# Patient Record
Sex: Female | Born: 1965 | Race: Black or African American | Hispanic: No | Smoking: Never smoker
Health system: Southern US, Community
[De-identification: ages and names within clinical notes are randomized; demographics above are authoritative.]

## PROBLEM LIST (undated history)

## (undated) DIAGNOSIS — N809 Endometriosis, unspecified: Secondary | ICD-10-CM

## (undated) DIAGNOSIS — R51 Headache: Secondary | ICD-10-CM

## (undated) DIAGNOSIS — I509 Heart failure, unspecified: Secondary | ICD-10-CM

## (undated) DIAGNOSIS — F419 Anxiety disorder, unspecified: Secondary | ICD-10-CM

## (undated) DIAGNOSIS — F32A Depression, unspecified: Secondary | ICD-10-CM

## (undated) DIAGNOSIS — I2699 Other pulmonary embolism without acute cor pulmonale: Secondary | ICD-10-CM

## (undated) DIAGNOSIS — I10 Essential (primary) hypertension: Secondary | ICD-10-CM

## (undated) DIAGNOSIS — F329 Major depressive disorder, single episode, unspecified: Secondary | ICD-10-CM

## (undated) DIAGNOSIS — I639 Cerebral infarction, unspecified: Secondary | ICD-10-CM

## (undated) DIAGNOSIS — R519 Headache, unspecified: Secondary | ICD-10-CM

## (undated) DIAGNOSIS — D6861 Antiphospholipid syndrome: Secondary | ICD-10-CM

## (undated) DIAGNOSIS — J90 Pleural effusion, not elsewhere classified: Secondary | ICD-10-CM

## (undated) HISTORY — PX: ABDOMINAL HYSTERECTOMY: SHX81

## (undated) HISTORY — PX: APPENDECTOMY: SHX54

## (undated) HISTORY — PX: PLEURAL BIOPSY: SHX5082

---

## 1999-07-04 ENCOUNTER — Other Ambulatory Visit: Admission: RE | Admit: 1999-07-04 | Discharge: 1999-07-04 | Payer: Self-pay | Admitting: Obstetrics and Gynecology

## 2000-07-08 ENCOUNTER — Other Ambulatory Visit: Admission: RE | Admit: 2000-07-08 | Discharge: 2000-07-08 | Payer: Self-pay | Admitting: Obstetrics and Gynecology

## 2001-07-31 ENCOUNTER — Other Ambulatory Visit: Admission: RE | Admit: 2001-07-31 | Discharge: 2001-07-31 | Payer: Self-pay | Admitting: Obstetrics and Gynecology

## 2002-07-28 ENCOUNTER — Other Ambulatory Visit: Admission: RE | Admit: 2002-07-28 | Discharge: 2002-07-28 | Payer: Self-pay | Admitting: Obstetrics and Gynecology

## 2003-08-09 ENCOUNTER — Other Ambulatory Visit: Admission: RE | Admit: 2003-08-09 | Discharge: 2003-08-09 | Payer: Self-pay | Admitting: Obstetrics and Gynecology

## 2016-04-06 ENCOUNTER — Other Ambulatory Visit: Payer: Self-pay | Admitting: Family Medicine

## 2016-04-06 DIAGNOSIS — Z1231 Encounter for screening mammogram for malignant neoplasm of breast: Secondary | ICD-10-CM

## 2016-04-26 ENCOUNTER — Ambulatory Visit: Payer: Self-pay

## 2016-04-27 ENCOUNTER — Ambulatory Visit
Admission: RE | Admit: 2016-04-27 | Discharge: 2016-04-27 | Disposition: A | Discharge status: Home / Self Care | Payer: BLUE CROSS/BLUE SHIELD | Source: Ambulatory Visit | Attending: Family Medicine | Admitting: Family Medicine

## 2016-04-27 DIAGNOSIS — Z1231 Encounter for screening mammogram for malignant neoplasm of breast: Secondary | ICD-10-CM

## 2016-05-09 ENCOUNTER — Other Ambulatory Visit: Payer: Self-pay | Admitting: Family Medicine

## 2016-05-09 DIAGNOSIS — R928 Other abnormal and inconclusive findings on diagnostic imaging of breast: Secondary | ICD-10-CM

## 2016-05-15 ENCOUNTER — Ambulatory Visit
Admission: RE | Admit: 2016-05-15 | Discharge: 2016-05-15 | Disposition: A | Discharge status: Home / Self Care | Payer: BLUE CROSS/BLUE SHIELD | Source: Ambulatory Visit | Attending: Family Medicine | Admitting: Family Medicine

## 2016-05-15 DIAGNOSIS — R928 Other abnormal and inconclusive findings on diagnostic imaging of breast: Secondary | ICD-10-CM

## 2016-06-13 ENCOUNTER — Other Ambulatory Visit: Payer: Self-pay | Admitting: Orthopedic Surgery

## 2016-06-18 ENCOUNTER — Encounter (HOSPITAL_COMMUNITY): Payer: Self-pay | Admitting: *Deleted

## 2016-06-18 ENCOUNTER — Encounter (HOSPITAL_COMMUNITY)
Admission: RE | Admit: 2016-06-18 | Discharge: 2016-06-18 | Disposition: A | Discharge status: Home / Self Care | Payer: BLUE CROSS/BLUE SHIELD | Source: Ambulatory Visit | Attending: Orthopedic Surgery | Admitting: Orthopedic Surgery

## 2016-06-18 DIAGNOSIS — D6861 Antiphospholipid syndrome: Secondary | ICD-10-CM | POA: Diagnosis not present

## 2016-06-18 DIAGNOSIS — F419 Anxiety disorder, unspecified: Secondary | ICD-10-CM | POA: Diagnosis not present

## 2016-06-18 DIAGNOSIS — Z8673 Personal history of transient ischemic attack (TIA), and cerebral infarction without residual deficits: Secondary | ICD-10-CM | POA: Diagnosis not present

## 2016-06-18 DIAGNOSIS — Z6832 Body mass index (BMI) 32.0-32.9, adult: Secondary | ICD-10-CM | POA: Diagnosis not present

## 2016-06-18 DIAGNOSIS — Z79899 Other long term (current) drug therapy: Secondary | ICD-10-CM | POA: Diagnosis not present

## 2016-06-18 DIAGNOSIS — M21372 Foot drop, left foot: Secondary | ICD-10-CM | POA: Diagnosis not present

## 2016-06-18 DIAGNOSIS — I110 Hypertensive heart disease with heart failure: Secondary | ICD-10-CM | POA: Diagnosis not present

## 2016-06-18 DIAGNOSIS — Z7982 Long term (current) use of aspirin: Secondary | ICD-10-CM | POA: Diagnosis not present

## 2016-06-18 DIAGNOSIS — Z86711 Personal history of pulmonary embolism: Secondary | ICD-10-CM | POA: Diagnosis not present

## 2016-06-18 DIAGNOSIS — Z7901 Long term (current) use of anticoagulants: Secondary | ICD-10-CM | POA: Diagnosis not present

## 2016-06-18 DIAGNOSIS — I509 Heart failure, unspecified: Secondary | ICD-10-CM | POA: Diagnosis not present

## 2016-06-18 DIAGNOSIS — F329 Major depressive disorder, single episode, unspecified: Secondary | ICD-10-CM | POA: Diagnosis not present

## 2016-06-18 HISTORY — DX: Other pulmonary embolism without acute cor pulmonale: I26.99

## 2016-06-18 HISTORY — DX: Headache, unspecified: R51.9

## 2016-06-18 HISTORY — DX: Anxiety disorder, unspecified: F41.9

## 2016-06-18 HISTORY — DX: Endometriosis, unspecified: N80.9

## 2016-06-18 HISTORY — DX: Essential (primary) hypertension: I10

## 2016-06-18 HISTORY — DX: Cerebral infarction, unspecified: I63.9

## 2016-06-18 HISTORY — DX: Major depressive disorder, single episode, unspecified: F32.9

## 2016-06-18 HISTORY — DX: Antiphospholipid syndrome: D68.61

## 2016-06-18 HISTORY — DX: Headache: R51

## 2016-06-18 HISTORY — DX: Depression, unspecified: F32.A

## 2016-06-18 HISTORY — DX: Pleural effusion, not elsewhere classified: J90

## 2016-06-18 HISTORY — DX: Heart failure, unspecified: I50.9

## 2016-06-18 LAB — BASIC METABOLIC PANEL
ANION GAP: 7 (ref 5–15)
BUN: 14 mg/dL (ref 6–20)
CALCIUM: 9.3 mg/dL (ref 8.9–10.3)
CO2: 25 mmol/L (ref 22–32)
Chloride: 109 mmol/L (ref 101–111)
Creatinine, Ser: 0.74 mg/dL (ref 0.44–1.00)
GFR calc Af Amer: >60 mL/min (ref >60)
GLUCOSE: 101 mg/dL — AB (ref 65–99)
Potassium: 3.8 mmol/L (ref 3.5–5.1)
Sodium: 141 mmol/L (ref 135–145)

## 2016-06-18 LAB — CBC
HEMATOCRIT: 39.3 % (ref 36.0–46.0)
Hemoglobin: 12.4 g/dL (ref 12.0–15.0)
MCH: 25.7 pg — ABNORMAL LOW (ref 26.0–34.0)
MCHC: 31.6 g/dL (ref 30.0–36.0)
MCV: 81.4 fL (ref 78.0–100.0)
PLATELETS: 248 10*3/uL (ref 150–400)
RBC: 4.83 MIL/uL (ref 3.87–5.11)
RDW: 15.3 % (ref 11.5–15.5)
WBC: 5.7 10*3/uL (ref 4.0–10.5)

## 2016-06-18 LAB — APTT: APTT: 45 s — AB (ref 24–36)

## 2016-06-18 LAB — PROTIME-INR
INR: 1.49
PROTHROMBIN TIME: 18.2 s — AB (ref 11.4–15.2)

## 2016-06-18 NOTE — Pre-Procedure Instructions (Signed)
    Denise KingfisherStacie Crane  06/18/2016      [facility] - [place] ([place]), [place] - [address] [place] [address] Phone: [phone] Fax: [phone]    Your procedure is scheduled on 06/21/16.  [facility] Admitting at 7 A.M.  Call this number if you have problems the morning of surgery:  [phone]   Remember:  Do not eat food or drink liquids after midnight.  Take these medicines the morning of surgery with A SIP OF WATER --none   Do not wear jewelry, make-up or nail polish.  Do not wear lotions, powders, or perfumes, or deoderant.  Do not shave 48 hours prior to surgery.  Men may shave face and neck.  Do not bring valuables to the hospital.  [facility] is not responsible for any belongings or valuables.  Contacts, dentures or bridgework may not be worn into surgery.  Leave your suitcase in the car.  After surgery it may be brought to your room.  For patients admitted to the hospital, discharge time will be determined by your treatment team.  Patients discharged the day of surgery will not be allowed to drive home.   Name and phone number of your driver:    Special instructions:    Please read over the following fact sheets that you were given.

## 2016-06-19 ENCOUNTER — Encounter (HOSPITAL_COMMUNITY): Payer: Self-pay

## 2016-06-19 NOTE — Progress Notes (Addendum)
Anesthesia Chart Review: Patient is a 50 year old female scheduled for left achilles lengthening, left posterior tibialis transfer to lateral cuneiform on 06/21/16 by Dr. Victorino DikeHewitt. 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 (I did confirm with patient that her first name is spelled Denise Crane--originally booked as Kennyth ArnoldStacy but was corrected in Epic. 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 (Patient told me that this was felt related to endometriosis involving the lung--thoracic endometriosis syndrome). She also states she was diagnosed with non-ischemic dilated cardiomyopathy in 2014 with "Echocardiography at that time demonstrated reduced LVEF in the range of 35-40%. The patient had normal myocardial perfusion on a nuclear stress test. LVEF by nuclear scan was 41%." Prior to her move, she was being followed in the Heart Failure Clinic with [facility] in [place] [facility]). Patient does not recall having the stress test and denied history of cardiac cath. She said her CHF was felt related to "medication", but EF improved with medication and exercising on her bike. She reports she was told her recent echo was fine.   - PCP is Dr. Katherina RightAnupreet Oberoi. Patient reports Dr. Phineas Semenberoi gave her perioperative  instructions on warfarin and Lovenox for this surgery.  - Neurologist is Dr. 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  - Cardiologist is Dr. Lanell MatarKenneth Bodek, first established 04/13/16.   *All are with [facility] (see Care Everywhere).  [place]. Records also indicate that she has a PhD in Chemistry.  Meds list to be updated. She prefers to bring her list in. Currently includes warfarin, Lovenox, Protonix, Lasix, losartan, Cymbalta, Coreg, baclofen, ASA 81mg , Symmetrel.   BP 121/76   Pulse 76   Temp 36.9 C   Resp 20   Ht 5\' 4"  (1.626 m)   Wt 191 lb 3.2 oz (86.7 kg)   SpO2 96%   BMI 32.82 kg/m   She denied chest pain, SOB, syncope.      04/13/16 EKG [facility]): sinus rhythm. Low voltage in limb leads. Nonspecific T abnormality  05/03/16 Echo [facility]):  - LV grossly normal in size. Mild concentric LVH. EF 55-60% - Mild (1+) TR - Pulmonary HTN is not suggested by doppler findings - LA is moderately dilated  Preoperative labs noted. PT 18.2, INR 1.49. PTT 45. She will need PT/PTT repeated on the day of surgery.   She reports history of CHF improved with medication. Cardiology notes suggest non-ischemic dilated cardiomyopathy. She is already on a Lovenox bridge per her PCP in anticipation of surgery.  If no changes, I anticipate pt can proceed with surgery as scheduled.   Rica Mastngela Naome Brigandi, FNP-BC [facility]/Anesthesiology Phone: [phone] 06/20/2016 1:16 PM

## 2016-06-21 ENCOUNTER — Ambulatory Visit (HOSPITAL_COMMUNITY): Payer: BLUE CROSS/BLUE SHIELD | Admitting: Vascular Surgery

## 2016-06-21 ENCOUNTER — Encounter (HOSPITAL_COMMUNITY)
Admission: RE | Disposition: A | Discharge status: Home / Self Care | Payer: Self-pay | Source: Ambulatory Visit | Attending: Orthopedic Surgery

## 2016-06-21 ENCOUNTER — Encounter (HOSPITAL_COMMUNITY): Payer: Self-pay | Admitting: *Deleted

## 2016-06-21 ENCOUNTER — Ambulatory Visit (HOSPITAL_COMMUNITY)
Admission: RE | Admit: 2016-06-21 | Discharge: 2016-06-21 | Disposition: A | Discharge status: Home / Self Care | Payer: BLUE CROSS/BLUE SHIELD | Source: Ambulatory Visit | Attending: Orthopedic Surgery | Admitting: Orthopedic Surgery

## 2016-06-21 DIAGNOSIS — M21372 Foot drop, left foot: Secondary | ICD-10-CM | POA: Diagnosis not present

## 2016-06-21 DIAGNOSIS — I509 Heart failure, unspecified: Secondary | ICD-10-CM | POA: Insufficient documentation

## 2016-06-21 DIAGNOSIS — Z79899 Other long term (current) drug therapy: Secondary | ICD-10-CM | POA: Insufficient documentation

## 2016-06-21 DIAGNOSIS — F329 Major depressive disorder, single episode, unspecified: Secondary | ICD-10-CM | POA: Insufficient documentation

## 2016-06-21 DIAGNOSIS — Z86711 Personal history of pulmonary embolism: Secondary | ICD-10-CM | POA: Insufficient documentation

## 2016-06-21 DIAGNOSIS — D6861 Antiphospholipid syndrome: Secondary | ICD-10-CM | POA: Insufficient documentation

## 2016-06-21 DIAGNOSIS — I110 Hypertensive heart disease with heart failure: Secondary | ICD-10-CM | POA: Insufficient documentation

## 2016-06-21 DIAGNOSIS — Z7901 Long term (current) use of anticoagulants: Secondary | ICD-10-CM | POA: Insufficient documentation

## 2016-06-21 DIAGNOSIS — Z7982 Long term (current) use of aspirin: Secondary | ICD-10-CM | POA: Insufficient documentation

## 2016-06-21 DIAGNOSIS — F419 Anxiety disorder, unspecified: Secondary | ICD-10-CM | POA: Insufficient documentation

## 2016-06-21 DIAGNOSIS — Z6832 Body mass index (BMI) 32.0-32.9, adult: Secondary | ICD-10-CM | POA: Insufficient documentation

## 2016-06-21 DIAGNOSIS — Z8673 Personal history of transient ischemic attack (TIA), and cerebral infarction without residual deficits: Secondary | ICD-10-CM | POA: Insufficient documentation

## 2016-06-21 HISTORY — PX: ACHILLES TENDON LENGTHENING: SHX6455

## 2016-06-21 LAB — APTT: aPTT: 30 seconds (ref 24–36)

## 2016-06-21 LAB — PROTIME-INR
INR: 1.06
PROTHROMBIN TIME: 13.8 s (ref 11.4–15.2)

## 2016-06-21 SURGERY — LENGTHENING, TENDON, ACHILLES
Anesthesia: General | Site: Ankle | Laterality: Left

## 2016-06-21 MED ORDER — PROPOFOL 10 MG/ML IV BOLUS
INTRAVENOUS | Status: DC | PRN
  Administered 2016-06-21: 200 mg via INTRAVENOUS

## 2016-06-21 MED ORDER — LACTATED RINGERS IV SOLN
INTRAVENOUS | Status: DC
  Administered 2016-06-21: 09:00:00 via INTRAVENOUS

## 2016-06-21 MED ORDER — ACETAMINOPHEN 325 MG PO TABS: 325.0000 mg | ORAL_TABLET | ORAL | Status: DC | PRN

## 2016-06-21 MED ORDER — FENTANYL CITRATE (PF) 100 MCG/2ML IJ SOLN
INTRAMUSCULAR | Status: AC
  Filled 2016-06-21: qty 2

## 2016-06-21 MED ORDER — MIDAZOLAM HCL 2 MG/2ML IJ SOLN
INTRAMUSCULAR | Status: AC
  Filled 2016-06-21: qty 2

## 2016-06-21 MED ORDER — LACTATED RINGERS IV SOLN
INTRAVENOUS | Status: DC | PRN
  Administered 2016-06-21 (×2): via INTRAVENOUS

## 2016-06-21 MED ORDER — OXYCODONE HCL 5 MG PO TABS: 5.0000 mg | ORAL_TABLET | Freq: Once | ORAL | Status: DC | PRN

## 2016-06-21 MED ORDER — OXYCODONE HCL 5 MG/5ML PO SOLN: 5.0000 mg | Freq: Once | ORAL | Status: DC | PRN

## 2016-06-21 MED ORDER — HYDROCODONE-ACETAMINOPHEN 5-325 MG PO TABS: 1.0000 | ORAL_TABLET | Freq: Four times a day (QID) | ORAL | 0 refills | Status: DC | PRN

## 2016-06-21 MED ORDER — LIDOCAINE HCL (CARDIAC) 20 MG/ML IV SOLN
INTRAVENOUS | Status: DC | PRN
  Administered 2016-06-21: 60 mg via INTRAVENOUS

## 2016-06-21 MED ORDER — SODIUM CHLORIDE 0.9 % IV SOLN: INTRAVENOUS | Status: DC

## 2016-06-21 MED ORDER — ROCURONIUM BROMIDE 10 MG/ML (PF) SYRINGE
PREFILLED_SYRINGE | INTRAVENOUS | Status: AC
  Filled 2016-06-21: qty 10

## 2016-06-21 MED ORDER — ACETAMINOPHEN 160 MG/5ML PO SOLN
325.0000 mg | ORAL | Status: DC | PRN
  Filled 2016-06-21: qty 20.3

## 2016-06-21 MED ORDER — LIDOCAINE 2% (20 MG/ML) 5 ML SYRINGE
INTRAMUSCULAR | Status: AC
  Filled 2016-06-21: qty 5

## 2016-06-21 MED ORDER — PROPOFOL 10 MG/ML IV BOLUS
INTRAVENOUS | Status: AC
  Filled 2016-06-21: qty 20

## 2016-06-21 MED ORDER — MIDAZOLAM HCL 5 MG/5ML IJ SOLN
INTRAMUSCULAR | Status: DC | PRN
  Administered 2016-06-21: 2 mg via INTRAVENOUS

## 2016-06-21 MED ORDER — CHLORHEXIDINE GLUCONATE 4 % EX LIQD: 60.0000 mL | Freq: Once | CUTANEOUS | Status: DC

## 2016-06-21 MED ORDER — PHENYLEPHRINE 40 MCG/ML (10ML) SYRINGE FOR IV PUSH (FOR BLOOD PRESSURE SUPPORT)
PREFILLED_SYRINGE | INTRAVENOUS | Status: AC
  Filled 2016-06-21: qty 10

## 2016-06-21 MED ORDER — FENTANYL CITRATE (PF) 100 MCG/2ML IJ SOLN
INTRAMUSCULAR | Status: DC | PRN
  Administered 2016-06-21 (×4): 25 ug via INTRAVENOUS
  Administered 2016-06-21: 50 ug via INTRAVENOUS
  Administered 2016-06-21 (×2): 25 ug via INTRAVENOUS

## 2016-06-21 MED ORDER — ONDANSETRON HCL 4 MG/2ML IJ SOLN
INTRAMUSCULAR | Status: DC | PRN
  Administered 2016-06-21: 4 mg via INTRAVENOUS

## 2016-06-21 MED ORDER — 0.9 % SODIUM CHLORIDE (POUR BTL) OPTIME
TOPICAL | Status: DC | PRN
  Administered 2016-06-21: 1000 mL

## 2016-06-21 MED ORDER — ONDANSETRON HCL 4 MG/2ML IJ SOLN
INTRAMUSCULAR | Status: AC
  Filled 2016-06-21: qty 2

## 2016-06-21 MED ORDER — FENTANYL CITRATE (PF) 100 MCG/2ML IJ SOLN: 25.0000 ug | INTRAMUSCULAR | Status: DC | PRN

## 2016-06-21 MED ORDER — CEFAZOLIN SODIUM-DEXTROSE 2-4 GM/100ML-% IV SOLN
2.0000 g | INTRAVENOUS | Status: AC
  Administered 2016-06-21: 2 g via INTRAVENOUS
  Filled 2016-06-21: qty 100

## 2016-06-21 SURGICAL SUPPLY — 59 items
BANDAGE ESMARK 6X9 LF (GAUZE/BANDAGES/DRESSINGS) ×1 IMPLANT
BLADE SURG 10 STRL SS (BLADE) IMPLANT
BNDG COHESIVE 4X5 TAN STRL (GAUZE/BANDAGES/DRESSINGS) IMPLANT
BNDG COHESIVE 6X5 TAN STRL LF (GAUZE/BANDAGES/DRESSINGS) IMPLANT
BNDG ESMARK 6X9 LF (GAUZE/BANDAGES/DRESSINGS) ×2
CANISTER SUCT 3000ML PPV (MISCELLANEOUS) ×2 IMPLANT
CHLORAPREP W/TINT 26ML (MISCELLANEOUS) ×2 IMPLANT
COVER MAYO STAND STRL (DRAPES) ×2 IMPLANT
COVER SURGICAL LIGHT HANDLE (MISCELLANEOUS) ×2 IMPLANT
CUFF TOURNIQUET SINGLE 34IN LL (TOURNIQUET CUFF) ×2 IMPLANT
CUFF TOURNIQUET SINGLE 44IN (TOURNIQUET CUFF) IMPLANT
DRAPE OEC MINIVIEW 54X84 (DRAPES) ×2 IMPLANT
DRAPE U-SHAPE 47X51 STRL (DRAPES) ×2 IMPLANT
DRSG ADAPTIC 3X8 NADH LF (GAUZE/BANDAGES/DRESSINGS) IMPLANT
DRSG MEPITEL 4X7.2 (GAUZE/BANDAGES/DRESSINGS) ×2 IMPLANT
DRSG PAD ABDOMINAL 8X10 ST (GAUZE/BANDAGES/DRESSINGS) IMPLANT
ELECT REM PT RETURN 9FT ADLT (ELECTROSURGICAL) ×2
ELECTRODE REM PT RTRN 9FT ADLT (ELECTROSURGICAL) ×1 IMPLANT
GLOVE BIO SURGEON STRL SZ7 (GLOVE) IMPLANT
GLOVE BIO SURGEON STRL SZ8 (GLOVE) ×2 IMPLANT
GLOVE BIOGEL PI IND STRL 7.5 (GLOVE) IMPLANT
GLOVE BIOGEL PI IND STRL 8 (GLOVE) ×1 IMPLANT
GLOVE BIOGEL PI INDICATOR 7.5 (GLOVE)
GLOVE BIOGEL PI INDICATOR 8 (GLOVE) ×1
GLOVE ECLIPSE 7.5 STRL STRAW (GLOVE) IMPLANT
GOWN STRL REUS W/ TWL LRG LVL3 (GOWN DISPOSABLE) IMPLANT
GOWN STRL REUS W/ TWL XL LVL3 (GOWN DISPOSABLE) ×2 IMPLANT
GOWN STRL REUS W/TWL LRG LVL3 (GOWN DISPOSABLE)
GOWN STRL REUS W/TWL XL LVL3 (GOWN DISPOSABLE) ×2
K-WIRE 1.1 (WIRE) ×2 IMPLANT
KIT BASIN OR (CUSTOM PROCEDURE TRAY) ×2 IMPLANT
KIT ROOM TURNOVER OR (KITS) ×2 IMPLANT
NEEDLE 22X1 1/2 (OR ONLY) (NEEDLE) IMPLANT
NS IRRIG 1000ML POUR BTL (IV SOLUTION) ×2 IMPLANT
PACK ORTHO EXTREMITY (CUSTOM PROCEDURE TRAY) ×2 IMPLANT
PAD ABD 8X10 STRL (GAUZE/BANDAGES/DRESSINGS) ×4 IMPLANT
PAD ARMBOARD 7.5X6 YLW CONV (MISCELLANEOUS) ×4 IMPLANT
PAD CAST 4YDX4 CTTN HI CHSV (CAST SUPPLIES) ×2 IMPLANT
PADDING CAST COTTON 4X4 STRL (CAST SUPPLIES) ×2
PADDING CAST COTTON 6X4 STRL (CAST SUPPLIES) ×2 IMPLANT
SCOTCHCAST PLUS 4X4 WHITE (CAST SUPPLIES) ×6 IMPLANT
SCREW CANN RATTLER 5X20 (Screw) ×4 IMPLANT
SOAP 2 % CHG 4 OZ (WOUND CARE) ×2 IMPLANT
SPONGE GAUZE 4X4 12PLY STER LF (GAUZE/BANDAGES/DRESSINGS) ×2 IMPLANT
SPONGE LAP 18X18 X RAY DECT (DISPOSABLE) IMPLANT
SUCTION FRAZIER HANDLE 10FR (MISCELLANEOUS) ×1
SUCTION TUBE FRAZIER 10FR DISP (MISCELLANEOUS) ×1 IMPLANT
SUT ETHILON 3 0 PS 1 (SUTURE) ×4 IMPLANT
SUT MNCRL AB 3-0 PS2 18 (SUTURE) ×4 IMPLANT
SUT PROLENE 3 0 PS 2 (SUTURE) IMPLANT
SUT VIC AB 0 CT1 27 (SUTURE) ×2
SUT VIC AB 0 CT1 27XBRD ANBCTR (SUTURE) ×2 IMPLANT
SUT VIC AB 3-0 PS2 18 (SUTURE)
SUT VIC AB 3-0 PS2 18XBRD (SUTURE) IMPLANT
SYR CONTROL 10ML LL (SYRINGE) IMPLANT
TOWEL OR 17X24 6PK STRL BLUE (TOWEL DISPOSABLE) IMPLANT
TOWEL OR 17X26 10 PK STRL BLUE (TOWEL DISPOSABLE) ×2 IMPLANT
TUBE CONNECTING 12X1/4 (SUCTIONS) ×2 IMPLANT
WATER STERILE IRR 1000ML POUR (IV SOLUTION) IMPLANT

## 2016-06-21 NOTE — H&P (Signed)
Denise KingfisherStacie Crane is an 50 y.o. female.   Chief Complaint: left foot drop HPI: 50 y/o female with PMH of stroke presents today for operative treatment of her left foot drop.  She has failed non op treatment to date for this condition including PT and bracing.    Past Medical History:  Diagnosis Date  . Antiphospholipid syndrome (HCC)   . Anxiety   . CHF (congestive heart failure) (HCC)   . Depression   . Endometriosis   . Headache   . Hypertension   . PE (pulmonary embolism)    2011  . Pleural effusion, right    right pleural effusion s/p VATS/pleurodesis '13   . Stroke [facility](HCC)     Past Surgical History:  Procedure Laterality Date  . ABDOMINAL HYSTERECTOMY    . APPENDECTOMY    . PLEURAL BIOPSY      History reviewed. No pertinent family history. Social History:  reports that she has never smoked. She has never used smokeless tobacco. She reports that she does not drink alcohol or use drugs.  Allergies:  Allergies  Allergen Reactions  . Ace Inhibitors Cough  . Benoxyl [Benzoyl Peroxide] Itching, Swelling and Rash  . Latex Itching and Other (See Comments)    Latex surgical tape    Medications Prior to Admission  Medication Sig Dispense Refill  . acetaminophen (TYLENOL) 500 MG tablet Take 500 mg by mouth every 6 (six) hours as needed for moderate pain or headache.    Marland Kitchen. amantadine (SYMMETREL) 100 MG capsule Take 100 mg by mouth daily.    Marland Kitchen. aspirin EC 81 MG tablet Take 81 mg by mouth daily.    . baclofen (LIORESAL) 10 MG tablet Take 10-20 mg by mouth 3 (three) times daily as needed for muscle spasms.     . carvedilol (COREG) 12.5 MG tablet Take 12.5 mg by mouth 2 (two) times daily.    . DULoxetine (CYMBALTA) 20 MG capsule Take 40 mg by mouth at bedtime.    . enoxaparin (LOVENOX) 80 MG/0.8ML injection Inject 80 mg into the skin every 12 (twelve) hours.    . furosemide (LASIX) 20 MG tablet Take 20 mg by mouth 2 (two) times daily as needed.    Marland Kitchen. losartan (COZAAR) 50 MG tablet Take 25  mg by mouth daily.    . pantoprazole (PROTONIX) 40 MG tablet Take 40 mg by mouth 2 (two) times daily.    . polyethylene glycol (MIRALAX / GLYCOLAX) packet Take 17 g by mouth daily.    Marland Kitchen. warfarin (COUMADIN) 5 MG tablet Take 5 mg by mouth daily.      No results found for this or any previous visit (from the past 48 hour(s)). No results found.  ROS  No recent f/c/n/v/wt loss  Blood pressure 110/65, pulse 67, temperature 98.4 F (36.9 C), temperature source Oral, resp. rate 18, height 5\' 4"  (1.626 m), weight 86.7 kg (191 lb 3.2 oz), SpO2 98 %. Physical Exam  wn wd woman in nad.  A and Ox  4.  Mood and affect normal.  EOMI.  Resp unlabored.  L foot with PF contracture of the achilles.  Skin healthy and intact.  Sens to LT diminshed at the dorsal foot.  No lymphadenopathy.    Assessment/Plan Left ankle tight heelcord and foot drop.  To OR for achilles tendon lengthening and transfer of posterior tibial tendon to the lateral cuneiform.  The risks and benefits of the alternative treatment options have been discussed in detail.  The  patient wishes to proceed with surgery and specifically understands risks of bleeding, infection, nerve damage, blood clots, need for additional surgery, amputation and death.   Toni Arthurs, MD 2016-07-12, 8:42 AM

## 2016-06-21 NOTE — Brief Op Note (Signed)
06/21/2016  10:59 AM  PATIENT:  Burnis KingfisherStacie Kenton  50 y.o. female  PRE-OPERATIVE DIAGNOSIS: 1.  Left foot drop      2.  Tight left heelcord  POST-OPERATIVE DIAGNOSIS:  Same  Procedure(s): 1.  LEFT percutaneous ACHILLES tendon LENGTHENING 2.  Deep transfer of left posterior tibial tendon to the lateral cuneiform 3.  AP, lateral and oblique xrays of the left foot  SURGEON:  Toni ArthursJohn Yatziri Wainwright, MD  ASSISTANT: n/a  ANESTHESIA:   General  EBL:  minimal   TOURNIQUET:   Total Tourniquet Time Documented: Thigh (Left) - 71 minutes Total: Thigh (Left) - 71 minutes  COMPLICATIONS:  None apparent  DISPOSITION:  Extubated, awake and stable to recovery.  DICTATION ID:  [id number]

## 2016-06-21 NOTE — Anesthesia Procedure Notes (Addendum)
Procedure Name: LMA Insertion Date/Time: 06/21/2016 9:15 AM Performed by: Leonel Ramsay'LAUGHLIN, Tira Lafferty H Pre-anesthesia Checklist: Patient identified, Emergency Drugs available, Suction available and Patient being monitored Patient Re-evaluated:Patient Re-evaluated prior to inductionOxygen Delivery Method: Circle System Utilized Preoxygenation: Pre-oxygenation with 100% oxygen Intubation Type: IV induction LMA: LMA inserted LMA Size: 4.0 Number of attempts: 1 Placement Confirmation: positive ETCO2 Tube secured with: Tape Dental Injury: Teeth and Oropharynx as per pre-operative assessment

## 2016-06-21 NOTE — Transfer of Care (Signed)
Immediate Anesthesia Transfer of Care Note  Patient: Denise Crane  Procedure(s) Performed: Procedure(s): LEFT ACHILLES LENGTHENING AND LEFT POSTIER TIBALIAS TRANSFER TO LATERAL CUNEIFORM (Left)  Patient Location: PACU  Anesthesia Type:General  Level of Consciousness: awake and alert   Airway & Oxygen Therapy: Patient Spontanous Breathing and Patient connected to nasal cannula oxygen  Post-op Assessment: Report given to RN and Post -op Vital signs reviewed and stable  Post vital signs: Reviewed and stable  Last Vitals:  Vitals:   06/21/16 1103 06/21/16 1104  BP: 126/80   Pulse:    Resp:    Temp:  36.4 C    Last Pain:  Vitals:   06/21/16 1104  TempSrc:   PainSc: 0-No pain      Patients Stated Pain Goal: 3 (06/21/16 0813)  Complications: No apparent anesthesia complications

## 2016-06-21 NOTE — Discharge Instructions (Addendum)
Toni ArthursJohn Hewitt, MD [facility]  Please read the following information regarding your care after surgery.  Medications  You only need a prescription for the narcotic pain medicine (ex. oxycodone, Percocet, Norco).  All of the other medicines listed below are available over the counter. X acetominophen (Tylenol) 650 mg every 4-6 hours as you need for minor pain OR X Norco as prescribed for moderate to severe pain   Narcotic pain medicine (ex. oxycodone, Percocet, Vicodin) will cause constipation.  To prevent this problem, take the following medicines while you are taking any pain medicine. X docusate sodium (Colace) 100 mg twice a day X senna (Senokot) 2 tablets twice a day  Resume your blood thinner tonight.  Weight Bearing ? Bear weight when you are able on your operated leg or foot. ? Bear weight only on the heel of your operated foot in the post-op shoe. X Do not bear any weight on the operated leg or foot.  Cast / Splint / Dressing X Keep your splint or cast clean and dry.  Dont put anything (coat hanger, pencil, etc) down inside of it.  If it gets damp, use a hair dryer on the cool setting to dry it.  If it gets soaked, call the office to schedule an appointment for a cast change. ? Remove your dressing 3 days after surgery and cover the incisions with dry dressings.    After your dressing, cast or splint is removed; you may shower, but do not soak or scrub the wound.  Allow the water to run over it, and then gently pat it dry.  Swelling It is normal for you to have swelling where you had surgery.  To reduce swelling and pain, keep your toes above your nose for at least 3 days after surgery.  It may be necessary to keep your foot or leg elevated for several weeks.  If it hurts, it should be elevated.  Follow Up Call my office at [phone] when you are discharged from the hospital or surgery center to schedule an appointment to be seen two weeks after surgery.  Call  my office at [phone] if you develop a fever >101.5 F, nausea, vomiting, bleeding from the surgical site or severe pain.

## 2016-06-21 NOTE — Anesthesia Preprocedure Evaluation (Signed)
Anesthesia Evaluation  Patient identified by MRN, date of birth, ID band Patient awake    Reviewed: Allergy & Precautions, NPO status , Patient's Chart, lab work & pertinent test results, reviewed documented beta blocker date and time   Airway Mallampati: I  TM Distance: >3 FB Neck ROM: Limited    Dental  (+) Teeth Intact   Pulmonary neg pulmonary ROS,    breath sounds clear to auscultation       Cardiovascular hypertension, Pt. on medications and Pt. on home beta blockers +CHF   Rhythm:Regular     Neuro/Psych  Headaches, PSYCHIATRIC DISORDERS Anxiety Depression CVA, Residual Symptoms    GI/Hepatic negative GI ROS, Neg liver ROS,   Endo/Other  Morbid obesity  Renal/GU negative Renal ROS     Musculoskeletal   Abdominal   Peds  Hematology   Anesthesia Other Findings Antiphospholipid antibody syndrome on anticoags  Reproductive/Obstetrics                             Anesthesia Physical Anesthesia Plan  ASA: II  Anesthesia Plan: General   Post-op Pain Management:    Induction: Intravenous  Airway Management Planned: LMA  Additional Equipment: None  Intra-op Plan:   Post-operative Plan: Extubation in OR  Informed Consent: I have reviewed the patients History and Physical, chart, labs and discussed the procedure including the risks, benefits and alternatives for the proposed anesthesia with the patient or authorized representative who has indicated his/her understanding and acceptance.   Dental advisory given  Plan Discussed with: CRNA and Surgeon  Anesthesia Plan Comments:         Anesthesia Quick Evaluation

## 2016-06-22 NOTE — Op Note (Signed)
NAMCandie Echevaria:  Tejada, Glynn                ACCOUNT NO.:  [id number]  MEDICAL RECORD NO.:  [id number]  LOCATION:  [facility]                         FACILITY:  [facility]  PHYSICIAN:  Toni ArthursJohn Britaney Espaillat, MD        DATE OF BIRTH:  [birth year]  DATE OF PROCEDURE:  06/21/2016 DATE OF DISCHARGE:  06/21/2016                              OPERATIVE REPORT   PREOPERATIVE DIAGNOSES: 1. Left foot drop. 2. Tight left heel cord.  POSTOPERATIVE DIAGNOSES: 1. Left foot drop. 2. Tight left heel cord.  PROCEDURES: 1. Left percutaneous Achilles tendon lengthening. 2. Deep transfer of the left posterior tibial tendon to the lateral     cuneiform. 3. AP lateral and oblique radiographs of the left foot.  SURGEON:  Toni ArthursJohn Ivanna Kocak, MD.  ANESTHESIA:  General.  ESTIMATED BLOOD LOSS:  Minimal.  TOURNIQUET TIME:  71 minutes at 250 mmHg.  COMPLICATIONS:  None apparent.  DISPOSITION:  Extubated, awake, and stable to recovery.  INDICATIONS FOR PROCEDURE:  The patient is a 50 year old woman with past medical history significant for stroke.  She has a left footdrop that has become difficult in terms of shoe wear and ambulation.  She has failed nonoperative treatment to date including activity modification, bracing and physical therapy.  She presents today for operative treatment for this challenging condition.  She understands the risks and benefits of the alternative treatment options and elects surgical treatment.  She specifically understands risks of bleeding, infection, nerve damage, blood clots, need for additional surgery, continued pain, recurrence of deformity, amputation, and death.  PROCEDURE IN DETAIL:  After preoperative consent was obtained, the correct operative site was identified, the patient was brought to the operating room and placed supine on the operating table.  General anesthesia was induced.  Preoperative antibiotics were administered. Surgical time-out was taken.  Left lower extremity was prepped  and draped in standard sterile fashion with a tourniquet around the thigh. The extremity was exsanguinated.  The tourniquet was inflated to 250 mmHg.  The patient's Achilles tendon was then lengthened using a triple hemi-section percutaneous technique.  The ankle was then dorsiflexed to approximately 20 degrees past neutral.  Attention was then turned to the medial aspect of the midfoot where a longitudinal incision was made over the posterior tibial tendon.  Sharp dissection was carried down through skin and subcutaneous tissue, and tendon sheath was incised.  The posterior tibial tendon was identified and it was released from its insertion on the navicular.  Attention was then turned to the medial leg where a longitudinal incision was made over the medial border of the tibia.  Dissection was carried down through the subcutaneous tissues and the deep fascia was incised.  The posterior tibial tendon was identified.  Blunt dissection was carried down along the posterior tibial tendon and it was pulled out through the proximal incision from the distal incision.  The end of the tendon was then trimmed and a whipstitch was placed with 0 Vicryl.  A large tonsil was then inserted posterior to the tibia in subperiosteal fashion and was advanced through the interosseous membrane.  The membrane was opened with the tips of the tonsil.  Incision was made over  the tips of the tonsil and the instrument was then placed from anterior to posterior.  It was used to grasp the sutures.  These were pulled out through the anterior incision along with the posterior tibial tendon.  Subcutaneous dissection was then carried down along the anterior aspect of the ankle to the area overlying the lateral cuneiform.  An incision was made after localizing appropriately with the x-ray.  The interval between the extensor hallucis brevis and extensor digitorum brevis muscle bellies was identified.  This was  bluntly dissected and the lateral cuneiform was exposed.  A guide pin was inserted and appropriate location of the pin was confirmed with an oblique fluoroscopic view.  The guide pin was then over-drilled with a 6.7 mm drill bit from the [employer] set.  A 6-mm screw was selected.  A Beath pin was then used to correct that.  The tendon was then pulled from the anterior proximal incision to the anterior distal incision.  A Beath pin was used to pull the tendon down into the tunnel drilled in the lateral cuneiform.  An attempt was made to insert a 6 mm screw.  This was unsuccessful.  A 5 mm x 20 screw was then inserted and was noted to have excellent purchase.  The wounds were then irrigated copiously.  The incisions were closed with Monocryl and nylon.  AP, lateral, and oblique radiographs showed appropriate position of the tunnel and of the screw.  Sterile dressings were applied followed by a well-padded short-leg cast.  The tourniquet was released after application of the dressings at 71 minutes.  The patient was awakened from anesthesia and transported to the recovery room in stable condition.  FOLLOWUP PLAN:  The patient will be nonweightbearing on the left foot for the next 2 weeks.  She will resume her anticoagulants at home.  We will see her back in the office in 2 weeks and plan to put her in a walking cast at that visit after removing her sutures.  RADIOGRAPHS:  AP, oblique, and lateral radiographs of the left foot were obtained intraoperatively.  These show appropriate alignment of the lateral cuneiform tunnel and no other acute injuries.     Toni Arthurs, MD     JH/MEDQ  D:  06/21/2016  T:  06/22/2016  Job:  [id number]

## 2016-06-25 ENCOUNTER — Encounter (HOSPITAL_COMMUNITY): Payer: Self-pay | Admitting: Orthopedic Surgery

## 2016-06-28 NOTE — Anesthesia Postprocedure Evaluation (Signed)
Anesthesia Post Note  Patient: Denise KingfisherStacie Crane  Procedure(s) Performed: Procedure(s) (LRB): LEFT ACHILLES LENGTHENING AND LEFT POSTIER TIBALIAS TRANSFER TO LATERAL CUNEIFORM (Left)  Patient location during evaluation: PACU Anesthesia Type: General Level of consciousness: awake Pain management: pain level controlled Vital Signs Assessment: post-procedure vital signs reviewed and stable Respiratory status: spontaneous breathing Cardiovascular status: stable Postop Assessment: no signs of nausea or vomiting Anesthetic complications: no    Last Vitals:  Vitals:   06/21/16 1133 06/21/16 1148  BP: 120/83 109/81  Pulse: 71 71  Resp: 12 18  Temp:      Last Pain:  Vitals:   06/21/16 1148  TempSrc:   PainSc: 2                  Leonette Tischer

## 2016-06-30 ENCOUNTER — Encounter (HOSPITAL_COMMUNITY): Payer: Self-pay | Admitting: Oncology

## 2016-06-30 ENCOUNTER — Emergency Department (HOSPITAL_COMMUNITY)
Admission: EM | Admit: 2016-06-30 | Discharge: 2016-07-01 | Disposition: A | Discharge status: Home / Self Care | Payer: BLUE CROSS/BLUE SHIELD | Attending: Emergency Medicine | Admitting: Emergency Medicine

## 2016-06-30 DIAGNOSIS — I509 Heart failure, unspecified: Secondary | ICD-10-CM | POA: Insufficient documentation

## 2016-06-30 DIAGNOSIS — I110 Hypertensive heart disease with heart failure: Secondary | ICD-10-CM | POA: Insufficient documentation

## 2016-06-30 DIAGNOSIS — Z79899 Other long term (current) drug therapy: Secondary | ICD-10-CM | POA: Diagnosis not present

## 2016-06-30 DIAGNOSIS — Z7982 Long term (current) use of aspirin: Secondary | ICD-10-CM | POA: Diagnosis not present

## 2016-06-30 DIAGNOSIS — Z7901 Long term (current) use of anticoagulants: Secondary | ICD-10-CM | POA: Diagnosis not present

## 2016-06-30 DIAGNOSIS — F4324 Adjustment disorder with disturbance of conduct: Secondary | ICD-10-CM | POA: Diagnosis not present

## 2016-06-30 DIAGNOSIS — R45850 Homicidal ideations: Secondary | ICD-10-CM | POA: Diagnosis present

## 2016-06-30 DIAGNOSIS — Z5181 Encounter for therapeutic drug level monitoring: Secondary | ICD-10-CM | POA: Diagnosis not present

## 2016-06-30 DIAGNOSIS — F4329 Adjustment disorder with other symptoms: Secondary | ICD-10-CM

## 2016-06-30 MED ORDER — POLYETHYLENE GLYCOL 3350 17 G PO PACK
17.0000 g | PACK | Freq: Every day | ORAL | Status: DC | PRN
  Filled 2016-06-30: qty 1

## 2016-06-30 MED ORDER — LOSARTAN POTASSIUM 25 MG PO TABS
25.0000 mg | ORAL_TABLET | Freq: Every day | ORAL | Status: DC
  Administered 2016-07-01: 25 mg via ORAL
  Filled 2016-06-30: qty 1

## 2016-06-30 MED ORDER — PANTOPRAZOLE SODIUM 40 MG PO TBEC
40.0000 mg | DELAYED_RELEASE_TABLET | Freq: Two times a day (BID) | ORAL | Status: DC
  Administered 2016-07-01: 40 mg via ORAL
  Filled 2016-06-30 (×2): qty 1

## 2016-06-30 MED ORDER — WARFARIN SODIUM 5 MG PO TABS
5.0000 mg | ORAL_TABLET | Freq: Every evening | ORAL | Status: DC
  Filled 2016-06-30: qty 1

## 2016-06-30 MED ORDER — BACLOFEN 10 MG PO TABS
10.0000 mg | ORAL_TABLET | Freq: Three times a day (TID) | ORAL | Status: DC | PRN
  Administered 2016-07-01: 10 mg via ORAL
  Filled 2016-06-30: qty 1

## 2016-06-30 MED ORDER — ONDANSETRON HCL 4 MG PO TABS: 4.0000 mg | ORAL_TABLET | Freq: Three times a day (TID) | ORAL | Status: DC | PRN

## 2016-06-30 MED ORDER — AMANTADINE HCL 100 MG PO CAPS
100.0000 mg | ORAL_CAPSULE | ORAL | Status: DC
  Administered 2016-07-01: 100 mg via ORAL
  Filled 2016-06-30: qty 1

## 2016-06-30 MED ORDER — WARFARIN - PHYSICIAN DOSING INPATIENT: Freq: Every day | Status: DC

## 2016-06-30 MED ORDER — ACETAMINOPHEN 325 MG PO TABS
650.0000 mg | ORAL_TABLET | ORAL | Status: DC | PRN
  Administered 2016-07-01 (×2): 650 mg via ORAL
  Filled 2016-06-30 (×2): qty 2

## 2016-06-30 MED ORDER — CARVEDILOL 12.5 MG PO TABS
12.5000 mg | ORAL_TABLET | Freq: Two times a day (BID) | ORAL | Status: DC
  Administered 2016-07-01 (×2): 12.5 mg via ORAL
  Filled 2016-06-30 (×4): qty 1

## 2016-06-30 MED ORDER — DULOXETINE HCL 20 MG PO CPEP
40.0000 mg | ORAL_CAPSULE | Freq: Every day | ORAL | Status: DC
  Administered 2016-07-01: 40 mg via ORAL
  Filled 2016-06-30: qty 2

## 2016-06-30 MED ORDER — NICOTINE 21 MG/24HR TD PT24
21.0000 mg | MEDICATED_PATCH | Freq: Every day | TRANSDERMAL | Status: DC
  Filled 2016-06-30: qty 1

## 2016-06-30 MED ORDER — ASPIRIN EC 81 MG PO TBEC
81.0000 mg | DELAYED_RELEASE_TABLET | Freq: Every day | ORAL | Status: DC
  Administered 2016-07-01: 81 mg via ORAL
  Filled 2016-06-30: qty 1

## 2016-06-30 NOTE — ED Notes (Signed)
Bed: WTR8 Expected date:  Expected time:  Means of arrival:  Comments: 

## 2016-06-30 NOTE — ED Notes (Signed)
Pt's parents came to visit patient, but pt did not want any visitors. If she changes her mind, they can be contacted at [phone] (pt's mother is Denise Crane)

## 2016-06-30 NOTE — ED Triage Notes (Addendum)
Per EMS pt reports feeling depressed for months. Could not quantify how many months.  D/t hx of CVA w/ left sided deficits, and current broken foot pt expressed frustration w/ limitations on her independence as well as strained relationship w/ her parents whom she lives with. Pt did endorse passive SI/HI. Pt told EMS, "Sometimes I look at a pen and want to stab my dad with it."   Pt is calm and cooperative.  Denies SI.  Does endorses HI towards her parents.
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
## 2016-07-01 DIAGNOSIS — Z79899 Other long term (current) drug therapy: Secondary | ICD-10-CM | POA: Diagnosis not present

## 2016-07-01 DIAGNOSIS — F4324 Adjustment disorder with disturbance of conduct: Secondary | ICD-10-CM | POA: Diagnosis not present

## 2016-07-01 DIAGNOSIS — F4329 Adjustment disorder with other symptoms: Secondary | ICD-10-CM | POA: Diagnosis not present

## 2016-07-01 LAB — RAPID URINE DRUG SCREEN, HOSP PERFORMED
Amphetamines: NOT DETECTED
Barbiturates: NOT DETECTED
Benzodiazepines: NOT DETECTED
Cocaine: NOT DETECTED
OPIATES: POSITIVE — AB
Tetrahydrocannabinol: NOT DETECTED

## 2016-07-01 LAB — COMPREHENSIVE METABOLIC PANEL
ALK PHOS: 77 U/L (ref 38–126)
ALT: 19 U/L (ref 14–54)
ANION GAP: 8 (ref 5–15)
AST: 15 U/L (ref 15–41)
Albumin: 4.3 g/dL (ref 3.5–5.0)
BILIRUBIN TOTAL: 0.8 mg/dL (ref 0.3–1.2)
BUN: 16 mg/dL (ref 6–20)
CALCIUM: 9.4 mg/dL (ref 8.9–10.3)
CO2: 27 mmol/L (ref 22–32)
CREATININE: 0.84 mg/dL (ref 0.44–1.00)
Chloride: 104 mmol/L (ref 101–111)
Glucose, Bld: 108 mg/dL — ABNORMAL HIGH (ref 65–99)
Potassium: 4 mmol/L (ref 3.5–5.1)
Sodium: 139 mmol/L (ref 135–145)
TOTAL PROTEIN: 8.6 g/dL — AB (ref 6.5–8.1)

## 2016-07-01 LAB — CBC
HCT: 36.3 % (ref 36.0–46.0)
Hemoglobin: 12.1 g/dL (ref 12.0–15.0)
MCH: 26.2 pg (ref 26.0–34.0)
MCHC: 33.3 g/dL (ref 30.0–36.0)
MCV: 78.7 fL (ref 78.0–100.0)
PLATELETS: 334 10*3/uL (ref 150–400)
RBC: 4.61 MIL/uL (ref 3.87–5.11)
RDW: 15.1 % (ref 11.5–15.5)
WBC: 7.9 10*3/uL (ref 4.0–10.5)

## 2016-07-01 LAB — PROTIME-INR
INR: 1.75
PROTHROMBIN TIME: 20.6 s — AB (ref 11.4–15.2)

## 2016-07-01 LAB — ETHANOL: Alcohol, Ethyl (B): <5 mg/dL (ref <5)

## 2016-07-01 NOTE — ED Notes (Signed)
No respiratory or acute distress noted alert and oriented x 3 call light in reach hospital sitter at bedside no reaction to medication noted pt eating and drinking no pain voiced.

## 2016-07-01 NOTE — ED Notes (Signed)
No respiratory or acute distress noted alert and oriented x 3 call light in reach hospital sitter at bedside states left leg with cast on it is hurting her good CSM noted in foot.

## 2016-07-01 NOTE — ED Notes (Signed)
No respiratory or acute distress noted alert and oriented x 3 call light in reach hospital sitter with pt no reaction to medication noted.

## 2016-07-01 NOTE — ED Notes (Signed)
Bed: [facility] Expected date:  Expected time:  Means of arrival:  Comments: 

## 2016-07-01 NOTE — ED Notes (Signed)
Pt taken to private room to place on bedpan to urinate. Pt also dressed and ready for discharge.

## 2016-07-01 NOTE — [facility] Suicide Risk Assessment (Signed)
Suicide Risk Assessment  Discharge Assessment   [facility]   Principal Problem: Adjustment disorder with disturbance of emotion Discharge Diagnoses:  Patient Active Problem List   Diagnosis Date Noted  . Adjustment disorder with disturbance of emotion [F43.29] 07/01/2016    Priority: High    Total Time spent with patient: 45 minutes  Musculoskeletal: Strength & Muscle Tone: within normal limits Gait & Station: unsteady, cast on left leg Patient leans: N/A  Psychiatric Specialty Exam: Physical Exam  Constitutional: She is oriented to person, place, and time. She appears well-developed and well-nourished.  HENT:  Head: Normocephalic.  Neck: Normal range of motion.  Respiratory: Effort normal.  Musculoskeletal: Normal range of motion.  Neurological: She is alert and oriented to person, place, and time.  Skin: Skin is warm and dry.  Psychiatric: She has a normal mood and affect. Her speech is normal and behavior is normal. Judgment and thought content normal.    Review of Systems  Constitutional: Negative.   HENT: Negative.   Eyes: Negative.   Respiratory: Negative.   Cardiovascular: Negative.   Gastrointestinal: Negative.   Genitourinary: Negative.   Musculoskeletal: Negative.   Skin: Negative.   Neurological: Negative.   Endo/Heme/Allergies: Negative.   Psychiatric/Behavioral: Negative.     Blood pressure 101/68, pulse 89, temperature 98.4 F (36.9 C), temperature source Oral, resp. rate 18, SpO2 96 %.There is no height or weight on file to calculate BMI.  General Appearance: Casual  Eye Contact:  Good  Speech:  Normal Rate  Volume:  Normal  Mood:  Irritable  Affect:  Congruent  Thought Process:  Coherent and Descriptions of Associations: Intact  Orientation:  Full (Time, Place, and Person)  Thought Content:  WDL  Suicidal Thoughts:  No  Homicidal Thoughts:  No  Memory:  Immediate;   Good Recent;   Good Remote;   Good  Judgement:   Fair  Insight:  Fair  Psychomotor Activity:  Normal  Concentration:  Concentration: Good and Attention Span: Good  Recall:  Good  Fund of Knowledge:  Good  Language:  Good  Akathisia:  No  Handed:  Right  AIMS (if indicated):     Assets:  Leisure Time Physical Health Resilience Social Support  ADL's:  Intact  Cognition:  WNL  Sleep:       Mental Status Per Nursing Assessment::   On Admission:   thoughts to hurt her parents  Demographic Factors:  NA  Loss Factors: Loss of significant relationship  Historical Factors: NA  Risk Reduction Factors:   Sense of responsibility to family, Living with another person, especially a relative and Positive social support  Continued Clinical Symptoms:  None   Cognitive Features That Contribute To Risk:  None    Suicide Risk:  Minimal: No identifiable suicidal ideation.  Patients presenting with no risk factors but with morbid ruminations; may be classified as minimal risk based on the severity of the depressive symptoms    Plan Of Care/Follow-up recommendations:  Activity:  as tolerated Diet:  heart healthy diet  LORD, JAMISON, NP 07/01/2016, 11:08 AM

## 2016-07-01 NOTE — ED Notes (Signed)
She is awake, alert and is enjoying talking with her sitter Aram Beecham(Cynthia).
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
## 2016-07-01 NOTE — ED Notes (Signed)
Her brother had visited; and now her father is here. We are prepping her for d/c. She is given pain med per her request. She also has eaten her lunch. She is in good spirits and denies s.i./h.i.

## 2016-07-01 NOTE — ED Notes (Signed)
No respiratory or acute distress noted alert and oriented x 3 hospital sitter at bedside no reaction to medication noted pt refused 0500 am venipuncture for PT/INR states she doesn't want to be stuck again just given her 5 mg coumadin she takes at home.

## 2016-07-01 NOTE — ED Notes (Signed)
Bed: [id number] Expected date:  Expected time:  Means of arrival:  Comments: hold

## 2016-07-01 NOTE — ED Notes (Signed)
She remains in no distress. I have attempted to phone her parents--left message. Pt. States they may be at church. Plan to phone later.

## 2016-07-01 NOTE — ED Notes (Signed)
No respiratory or acute distress noted hospital sitter with pt resting in bed with eyes closed.
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
## 2016-07-10 ENCOUNTER — Other Ambulatory Visit: Payer: Self-pay | Admitting: Family Medicine

## 2016-07-10 DIAGNOSIS — N63 Unspecified lump in unspecified breast: Secondary | ICD-10-CM

## 2016-08-13 ENCOUNTER — Ambulatory Visit
Admission: RE | Admit: 2016-08-13 | Discharge: 2016-08-13 | Disposition: A | Discharge status: Home / Self Care | Payer: BLUE CROSS/BLUE SHIELD | Source: Ambulatory Visit | Attending: Family Medicine | Admitting: Family Medicine

## 2016-08-13 DIAGNOSIS — N63 Unspecified lump in unspecified breast: Secondary | ICD-10-CM

## 2016-08-21 ENCOUNTER — Ambulatory Visit: Payer: BLUE CROSS/BLUE SHIELD | Attending: Student | Admitting: Physical Therapy

## 2016-08-21 DIAGNOSIS — R2689 Other abnormalities of gait and mobility: Secondary | ICD-10-CM

## 2016-08-21 DIAGNOSIS — M25672 Stiffness of left ankle, not elsewhere classified: Secondary | ICD-10-CM | POA: Diagnosis present

## 2016-08-21 DIAGNOSIS — M6281 Muscle weakness (generalized): Secondary | ICD-10-CM

## 2016-08-21 NOTE — Therapy (Signed)
[facility] [address] [place], [place], [address] Phone: [phone]   Fax:  [phone]  Physical Therapy Evaluation  Patient Details  Name: Denise KingfisherStacie Crane MRN: [id number] Date of Birth: 1966-09-11 Referring Provider: Dr. Toni ArthursJohn Hewitt  Encounter Date: 08/21/2016      PT End of Session - 08/21/16 1302    Visit Number 1   Number of Visits 12   Date for PT Re-Evaluation 10/02/16   Authorization Type Medicare   PT Start Time 1100   PT Stop Time 1141   PT Time Calculation (min) 41 min   Activity Tolerance Patient tolerated treatment well   Behavior [facility] Therapy [facility] for tasks assessed/performed      Past Medical History:  Diagnosis Date  . Antiphospholipid syndrome ([facility])   . Anxiety   . CHF (congestive heart failure) ([facility])   . Depression   . Endometriosis   . Headache   . Hypertension   . PE (pulmonary embolism)    2011  . Pleural effusion, right    right pleural effusion s/p VATS/pleurodesis '13   . Stroke [facility])     Past Surgical History:  Procedure Laterality Date  . ABDOMINAL HYSTERECTOMY    . ACHILLES TENDON LENGTHENING Left 06/21/2016   Procedure: LEFT ACHILLES LENGTHENING AND LEFT POSTIER TIBALIAS TRANSFER TO LATERAL CUNEIFORM;  Surgeon: Toni ArthursJohn Hewitt, MD;  Location: [facility];  Service: Orthopedics;  Laterality: Left;  . APPENDECTOMY    . PLEURAL BIOPSY      There were no vitals filed for this visit.       Subjective Assessment - 08/21/16 1103    Subjective Pt is a 50 y/o female who presents to OPPT s/p achilles tendon lengthening on 06/21/16.  Pt presents today still in CAM boot, and reports occasional "twinges" of pain.   Pt had AFO but wants to stop using it.   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 No/denies            [facility] - 08/21/16 1107      Assessment   Medical Diagnosis L achilles tendon lengthening   Referring Provider Dr. Toni ArthursJohn Hewitt   Onset Date/Surgical Date 06/21/16   Next MD Visit 09/05/16   Prior Therapy following CVA     Precautions   Precaution Comments CAM boot until 12/13, weight bearing only in boot; no passive plantarflexion   Required Braces or Orthoses Other Brace/Splint   Other Brace/Splint L CAM boot     Restrictions   Weight Bearing Restrictions No     Balance Screen   Has the patient fallen in the past 6 months No   Has the patient had a decrease in activity level because of a fear of falling?  No   Is the patient reluctant to leave their home because of a fear of falling?  No     Home Environment   Living Environment Private residence   Living Arrangements Parent   Type of Home House   Home Access Stairs to enter   Entrance Stairs-Number of Steps 5   Entrance Stairs-Rails Right;Left;Can reach both   Home Layout Two level;Able to live on main level with bedroom/bathroom   Additional Comments descends stairs with  step to pattern     Prior Function   Level of Independence Independent with basic ADLs;Requires assistive device for independence   Vocation On disability   Leisure bowling, plays cards, "run away from my parents" shopping     Cognition   Overall Cognitive Status Within Functional Limits for tasks assessed     Observation/Other Assessments   Skin Integrity healed incisions with puckering and scar tissue noted; increased edema LLE     AROM   AROM Assessment Site Ankle   Right/Left Ankle Left   Left Ankle Dorsiflexion 2  past neutral   Left Ankle Plantar Flexion 18   Left Ankle Inversion 11   Left Ankle Eversion 0     PROM   PROM Assessment Site Ankle   Right/Left Ankle Left   Left Ankle Dorsiflexion 12  past neutral   Left Ankle Inversion 23    Left Ankle Eversion 10     Strength   Strength Assessment Site Ankle   Right/Left Ankle Left   Left Ankle Dorsiflexion 1/5   Left Ankle Plantar Flexion 1/5   Left Ankle Inversion 1/5   Left Ankle Eversion 0/5     Ambulation/Gait   Ambulation/Gait Yes   Ambulation/Gait Assistance 6: Modified independent (Device/Increase time)   Ambulation Distance (Feet) 75 Feet   Assistive device Straight cane   Gait Pattern Decreased stance time - left;Decreased step length - right;Abducted - left;Decreased hip/knee flexion - left;Decreased weight shift to left   Ambulation Surface Level;Indoor                   [facility] Adult PT Treatment/Exercise - 08/21/16 1107      Self-Care   Self-Care Scar Mobilizations   Scar Mobilizations L ankle     Exercises   Exercises Ankle     Manual Therapy   Manual Therapy Passive ROM   Passive ROM L ankle dorsiflexion in supine     Ankle Exercises: Stretches   Gastroc Stretch 2 reps;30 seconds   Gastroc Stretch Limitations supine with towel     Ankle Exercises: Supine   Other Supine Ankle Exercises ankle pumps x 10 (within available range); inversion/eversion x 10 within available range                PT Education - 08/21/16 1302    Education provided Yes   Education Details HEP   Person(s) Educated Patient;Parent(s)   Methods Demonstration;Explanation;Handout   Comprehension Verbalized understanding;Returned demonstration;Need further instruction          PT Short Term Goals - 08/21/16 1308      PT SHORT TERM GOAL #1   Title independent with HEP (09/07/16)   Time 2   Period Weeks   Status New           PT Long Term Goals - 08/21/16 1309      PT LONG TERM GOAL #1   Title demonstrate 4 degrees active dorsiflexion past neutral for improved ankle mobility (10/02/16)   Time 6   Period Weeks   Status New     PT LONG TERM GOAL #2   Title ambulate without boot with supervision and SPC > 150' for improved function  (10/02/16)   Time 6   Period Weeks   Status New     PT LONG TERM GOAL #3   Title assess need for AFO once pt able to amb without boot (10/02/16)   Time 6   Period Weeks   Status New  Plan - 2016/09/18 1303    Clinical Impression Statement Pt is a 50 y/o female who presents to OPPT s/p L achilles tendon lengthening with deep transfer of the left posterior tibial tendon to the lateral cuneiform on 06/21/16.  Pt demonstrates decreased ROM and gait abnormalities affecting functional mobility.  Pt's goal for PT is to no longer need AFO however feel this may be unrealistic as pt only has trace dorsiflexion and other significant ankle deficits due to CVA.  Will plan to assess for new AFO once pt allowed to amb without boot.  Will benefit from PT to maximize function and address deficits.   Rehab Potential Fair   PT Frequency Other (comment)  1x/wk x 2 wks then 2x/wk x 4 wks   PT Duration 6 weeks   PT Treatment/Interventions ADLs/Self Care Home Management;Electrical Stimulation;Cryotherapy;Moist Heat;Ultrasound;Neuromuscular re-education;Balance training;Therapeutic exercise;Therapeutic activities;Functional mobility training;Stair training;Gait training;Patient/family education;Manual techniques;Scar mobilization;Passive range of motion   PT Next Visit Plan review HEP, supine ankle exercises (active), manual and gastroc stretching   Consulted and Agree with Plan of Care Patient;Family member/caregiver   Family Member Consulted father      Patient will benefit from skilled therapeutic intervention in order to improve the following deficits and impairments:  Abnormal gait, Decreased scar mobility, Decreased strength, Decreased range of motion, Difficulty walking, Decreased activity tolerance  Visit Diagnosis: Stiffness of left ankle, not elsewhere classified - Plan: PT plan of care cert/re-cert  Muscle weakness (generalized) - Plan: PT plan of care cert/re-cert  Other  abnormalities of gait and mobility - Plan: PT plan of care cert/re-cert      G-Codes - 18-Sep-2016 1311    Functional Assessment Tool Used clinical judgement   Functional Limitation Mobility: Walking and moving around   Mobility: Walking and Moving Around Current Status [phone]) At least 60 percent but less than 80 percent impaired, limited or restricted   Mobility: Walking and Moving Around Goal Status [phone]) At least 40 percent but less than 60 percent impaired, limited or restricted       Problem List Patient Active Problem List   Diagnosis Date Noted  . Adjustment disorder with disturbance of emotion 07/01/2016       Clarita Crane, PT, DPT 18-Sep-2016 1:13 PM    [facility] [address] [place], [place], [address] Phone: [phone]   Fax:  [phone]  Name: Denise Crane MRN: [id number] Date of Birth: March 28, 1966

## 2016-08-21 NOTE — Patient Instructions (Signed)
  ROM: Inversion / Eversion   With left leg relaxed, gently turn ankle and foot in and out. Move through full range of motion. Avoid pain. Repeat __10-15__ times per set. Do _1___ sets per session. Do __2-3__ sessions per day.  http://orth.exer.us/36   Copyright  VHI. All rights reserved.   ROM: Plantar / Dorsiflexion   With left leg relaxed, gently flex and extend ankle. Move through full range of motion. Avoid pain. Repeat _10-15___ times per set. Do _1___ sets per session. Do __2-3__ sessions per day.  http://orth.exer.us/34   Copyright  VHI. All rights reserved.   Gastroc / Heel Cord Stretch - Seated With Towel    Sit on bed, towel around ball of foot. Gently pull foot in toward body, stretching heel cord and calf. Hold for _30__ seconds.  Repeat _2-3__ times. Do _2-3__ times per day.  Copyright  VHI. All rights reserved.     Wear compression sock to help with swelling.  Massage scars 2-3 times a day.

## 2016-08-29 ENCOUNTER — Ambulatory Visit: Payer: BLUE CROSS/BLUE SHIELD | Attending: Student | Admitting: Physical Therapy

## 2016-08-29 DIAGNOSIS — R2689 Other abnormalities of gait and mobility: Secondary | ICD-10-CM | POA: Diagnosis present

## 2016-08-29 DIAGNOSIS — M6281 Muscle weakness (generalized): Secondary | ICD-10-CM | POA: Diagnosis present

## 2016-08-29 DIAGNOSIS — M25672 Stiffness of left ankle, not elsewhere classified: Secondary | ICD-10-CM | POA: Diagnosis not present

## 2016-08-29 NOTE — Patient Instructions (Addendum)
FLEXION: Sitting (Active)    Sit with left leg extended. Bend left knee and draw foot, keeping your heel on the ground. Hold this position with your heel on the ground for 15-30 seconds. Complete _3 ___ repetitions. Perform _1-2__ sessions per day.  Copyright  VHI. All rights reserved.  HIP: Hamstrings - Short Sitting    Rest leg on raised surface (6-8 inches). Keep knee straight. Lift chest and lean forward until you feel a stretch. Hold __30_ seconds. _3__ reps per set, _1-2__ sets per day  Copyright  VHI. All rights reserved.

## 2016-08-29 NOTE — Therapy (Signed)
[facility] [address] [place], [place], [address] Phone: [phone]   Fax:  [phone]  Physical Therapy Treatment  Patient Details  Name: Denise Crane MRN: [id number] Date of Birth: Feb 07, 1966 Referring Provider: Dr. Toni ArthursJohn Hewitt  Encounter Date: 08/29/2016      PT End of Session - 08/29/16 2143    Visit Number 2   Number of Visits 12   Date for PT Re-Evaluation 10/02/16   Authorization Type Medicare   PT Start Time 1150   PT Stop Time 1229   PT Time Calculation (min) 39 min   Activity Tolerance Patient tolerated treatment well   Behavior During Therapy [facility] for tasks assessed/performed      Past Medical History:  Diagnosis Date  . Antiphospholipid syndrome (HCC)   . Anxiety   . CHF (congestive heart failure) (HCC)   . Depression   . Endometriosis   . Headache   . Hypertension   . PE (pulmonary embolism)    2011  . Pleural effusion, right    right pleural effusion s/p VATS/pleurodesis '13   . Stroke [facility](HCC)     Past Surgical History:  Procedure Laterality Date  . ABDOMINAL HYSTERECTOMY    . ACHILLES TENDON LENGTHENING Left 06/21/2016   Procedure: LEFT ACHILLES LENGTHENING AND LEFT POSTIER TIBALIAS TRANSFER TO LATERAL CUNEIFORM;  Surgeon: Toni ArthursJohn Hewitt, MD;  [facility];  Service: Orthopedics;  Laterality: Left;  . APPENDECTOMY    . PLEURAL BIOPSY      There were no vitals filed for this visit.      Subjective Assessment - 08/29/16 1152    Subjective No changes since last visit.  I'm not wearing a brace (AFO)-another brace is not an option.   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 No/denies                         [facility]  Treatment/Exercise - 08/29/16 0001      Exercises   Exercises Ankle;Other Exercises   Other Exercises  Seated hamstring stretch LLE with LLE propped on 8" block, x 3 reps 30 seconds each.     Manual Therapy   Manual Therapy Passive ROM   Passive ROM L ankle dorsiflexion in supine  x 10 reps, 10 sec each     Ankle Exercises: Stretches   Gastroc Stretch 30 seconds;3 reps   Gastroc Stretch Limitations supine with towel   Other Stretch Passive gastroc stretch 5 reps x 30 seconds each     Ankle Exercises: Seated   Other Seated Ankle Exercises Review of HEP provided last visit-pt needs verbal cues for proper technique for ankle eversion/inversion and review of towel stretch.  Supine A/AROM ankle dorsiflexion on L, with A/ROM plantarflexion.  Seated heelslides x 10 reps with tactile cues given at L knee for positioning.       Cues provided (verbal and manual) to slow pace of active ROM dorsiflexion/plantarflexion and P/ROM eversion/inversion for proper technique.  P/ROM seated inversion/eversion in sitting.  Cues provided for seated gastroc stretch to keep heels on ground for proper positioning.   Brief discussion/review of scar massage, with pt verbalizing understanding.  PT Education - 08/29/16 2143    Education provided Yes   Education Details Additions to HEP-see instructions   Person(s) Educated Patient;Parent(s)   Methods Explanation;Demonstration;Handout   Comprehension Verbalized understanding;Returned demonstration;Need further instruction          PT Short Term Goals - 08/21/16 1308      PT SHORT TERM GOAL #1   Title independent with HEP (09/07/16)   Time 2   Period Weeks   Status New           PT Long Term Goals - 08/21/16 1309      PT LONG TERM GOAL #1   Title demonstrate 4 degrees active dorsiflexion past neutral for improved ankle mobility (10/02/16)   Time 6   Period Weeks   Status New     PT LONG TERM GOAL #2   Title ambulate without  boot with supervision and SPC > 150' for improved function (10/02/16)   Time 6   Period Weeks   Status New     PT LONG TERM GOAL #3   Title assess need for AFO once pt able to amb without boot (10/02/16)   Time 6   Period Weeks   Status New               Plan - 08/29/16 2144    Clinical Impression Statement Skilled PT session addressed review of HEP and instruction in proper performance of HEP provided last visit.  Additional passive, A/AROM and active ROM exercise performed this visit, with pt continuing to have trace active ankle dorsiflexion (with increased inversion and supination) due to CVA.  Pt to see MD prior to PT visit next week, and pt will conitnue to benefit from further skilled PT to maximize function and address stiffness/ROM.   Rehab Potential Fair   PT Frequency Other (comment)  1x/wk x 2 wks then 2x/wk x 4 wks   PT Duration 6 weeks   PT Treatment/Interventions ADLs/Self Care Home Management;Electrical Stimulation;Cryotherapy;Moist Heat;Ultrasound;Neuromuscular re-education;Balance training;Therapeutic exercise;Therapeutic activities;Functional mobility training;Stair training;Gait training;Patient/family education;Manual techniques;Scar mobilization;Passive range of motion   PT Next Visit Plan review HEP, supine ankle exercises (active), manual and gastroc stretching   Consulted and Agree with Plan of Care Patient;Family member/caregiver   Family Member Consulted father      Patient will benefit from skilled therapeutic intervention in order to improve the following deficits and impairments:  Abnormal gait, Decreased scar mobility, Decreased strength, Decreased range of motion, Difficulty walking, Decreased activity tolerance  Visit Diagnosis: Stiffness of left ankle, not elsewhere classified  Muscle weakness (generalized)     Problem List Patient Active Problem List   Diagnosis Date Noted  . Adjustment disorder with disturbance of emotion 07/01/2016     [name] W. 08/29/2016, 9:47 PM  Gean MaidensMARRIOTT,Jaykwon Morones W., PT  [place] [facility] [address] [place], [place], [address] Phone: [phone]   Fax:  [phone]  Name: Denise Crane MRN: [id number] Date of Birth: [birth year]

## 2016-09-06 ENCOUNTER — Ambulatory Visit: Payer: BLUE CROSS/BLUE SHIELD | Admitting: Physical Therapy

## 2016-09-06 DIAGNOSIS — M25672 Stiffness of left ankle, not elsewhere classified: Secondary | ICD-10-CM

## 2016-09-06 DIAGNOSIS — R2689 Other abnormalities of gait and mobility: Secondary | ICD-10-CM

## 2016-09-06 DIAGNOSIS — M6281 Muscle weakness (generalized): Secondary | ICD-10-CM

## 2016-09-06 NOTE — Therapy (Signed)
[facility] [address] [place], [place], [address] Phone: [phone]   Fax:  [phone]  Physical Therapy Treatment  Patient Details  Name: Denise KingfisherStacie Scarbro MRN: [id number] Date of Birth: [birth year] Referring Provider: Dr. Toni ArthursJohn Hewitt  Encounter Date: 09/06/2016      PT End of Session - 09/06/16 1147    Visit Number 3   Number of Visits 12   Date for PT Re-Evaluation 10/02/16   Authorization Type Medicare   PT Start Time 1100   PT Stop Time 1141   PT Time Calculation (min) 41 min   Equipment Utilized During Treatment Gait belt   Activity Tolerance Patient tolerated treatment well   Behavior During Therapy [facility] for tasks assessed/performed      Past Medical History:  Diagnosis Date  . Antiphospholipid syndrome (HCC)   . Anxiety   . CHF (congestive heart failure) (HCC)   . Depression   . Endometriosis   . Headache   . Hypertension   . PE (pulmonary embolism)    2011  . Pleural effusion, right    right pleural effusion s/p VATS/pleurodesis '13   . Stroke [facility](HCC)     Past Surgical History:  Procedure Laterality Date  . ABDOMINAL HYSTERECTOMY    . ACHILLES TENDON LENGTHENING Left 06/21/2016   Procedure: LEFT ACHILLES LENGTHENING AND LEFT POSTIER TIBALIAS TRANSFER TO LATERAL CUNEIFORM;  Surgeon: Toni ArthursJohn Hewitt, MD;  [facility];  Service: Orthopedics;  Laterality: Left;  . APPENDECTOMY    . PLEURAL BIOPSY      There were no vitals filed for this visit.      Subjective Assessment - 09/06/16 1102    Subjective Went to MD yesterday; everything is looking good.  Pt states she's with us "until I'm out of the brace."   Limitations Walking;House hold activities   Patient Stated Goals out of boot, walk, wear normal shoes, no longer use AFO, drive eventually (no driving since CVA)   Currently in Pain? No/denies                         [facility] Treatment/Exercise - 09/06/16 1116      Ambulation/Gait   Ambulation/Gait Yes   Ambulation/Gait Assistance 5: Supervision   Ambulation Distance (Feet) 250 Feet   Assistive device Straight cane   Gait Pattern Decreased stance time - left;Decreased step length - right;Abducted - left;Decreased hip/knee flexion - left;Decreased weight shift to left   Gait Comments no AFO for gait training and assessment; pt with increased foot slap on L with occasional episodes of L toe catching.  Utilitzed simulated toe cap with good response.  Discussed possiblity of needing toe cap and likely at least ASO for ankle stability to decrease sprain or fracture risk.  Pt open to these options instead of AFO.     Ankle Exercises: Stretches   Gastroc Stretch 3 reps;30 seconds   Gastroc Stretch Limitations standing     Ankle Exercises: Standing   SLS 5x10 sec with UE support   Heel Raises 3 seconds;15 reps  focusing on LLE     Ankle Exercises: Aerobic   Stationary Bike NuStep L4 x 8 min                PT Education - 09/06/16 1146    Education provided Yes   Education Details standing HEP   Person(s) Educated Patient;Parent(s)   Methods Explanation;Handout;Demonstration   Comprehension Verbalized understanding  PT Short Term Goals - 09/06/16 1147      PT SHORT TERM GOAL #1   Title independent with HEP (09/07/16)   Status On-going           PT Long Term Goals - 08/21/16 1309      PT LONG TERM GOAL #1   Title demonstrate 4 degrees active dorsiflexion past neutral for improved ankle mobility (10/02/16)   Time 6   Period Weeks   Status New     PT LONG TERM GOAL #2   Title ambulate without boot with supervision and SPC > 150' for improved function (10/02/16)   Time 6   Period Weeks   Status New     PT LONG TERM GOAL #3   Title assess need for AFO once pt able to amb without boot (10/02/16)   Time 6   Period Weeks   Status New               Plan - 09/06/16 1147    Clinical Impression Statement Pt  continues to have difficulty with HEP due to ankle weakness.  Pt now out of CAM boot and in regular shoe with AFO.  Amb without AFO with decreased foot clearance but feel pt will benefit from leather toe cap to help with clearance.  May need long term stability brace to prevent sprain or fractue and pt open to ASO but adamant against AFO.   PT Treatment/Interventions ADLs/Self Care Home Management;Electrical Stimulation;Cryotherapy;Moist Heat;Ultrasound;Neuromuscular re-education;Balance training;Therapeutic exercise;Therapeutic activities;Functional mobility training;Stair training;Gait training;Patient/family education;Manual techniques;Scar mobilization;Passive range of motion   PT Next Visit Plan review HEP, standing exercises to improve ankle stability   Consulted and Agree with Plan of Care Patient;Family member/caregiver   Family Member Consulted father      Patient will benefit from skilled therapeutic intervention in order to improve the following deficits and impairments:  Abnormal gait, Decreased scar mobility, Decreased strength, Decreased range of motion, Difficulty walking, Decreased activity tolerance  Visit Diagnosis: Stiffness of left ankle, not elsewhere classified  Muscle weakness (generalized)  Other abnormalities of gait and mobility     Problem List Patient Active Problem List   Diagnosis Date Noted  . Adjustment disorder with disturbance of emotion 07/01/2016      Clarita CraneStephanie F Mckay Brandt, PT, DPT 09/06/16 11:50 AM    [place] [facility] [address] [place], [place], [address] Phone: [phone]   Fax:  [phone]  Name: Denise KingfisherStacie Yerby MRN: [id number] Date of Birth: 09-02-1966

## 2016-09-06 NOTE — Patient Instructions (Signed)
Achilles / Gastroc, Standing    Stand, left foot behind, heel on floor and turned slightly out, leg straight, forward leg bent. Move hips forward. Hold _30__ seconds. Repeat __3_ times per session. Do __2-3_ sessions per day.  Copyright  VHI. All rights reserved.   Achilles / Soleus, Standing    Stand, right foot behind, heel on floor and turned slightly out. Lower hips and bend knees. Hold _30__ seconds. Repeat _3__ times per session. Do _2-3__ sessions per day.  Copyright  VHI. All rights reserved.   Single Leg - Eyes Open    Holding support, lift right leg while maintaining balance over other leg. Progress to removing hands from support surface for longer periods of time. Hold_15-30___ seconds. Repeat __5_ times per session. Do __2-3__ sessions per day.  Copyright  VHI. All rights reserved.

## 2016-09-11 ENCOUNTER — Ambulatory Visit: Payer: BLUE CROSS/BLUE SHIELD | Admitting: Physical Therapy

## 2016-09-11 DIAGNOSIS — M25672 Stiffness of left ankle, not elsewhere classified: Secondary | ICD-10-CM

## 2016-09-11 DIAGNOSIS — R2689 Other abnormalities of gait and mobility: Secondary | ICD-10-CM

## 2016-09-11 DIAGNOSIS — M6281 Muscle weakness (generalized): Secondary | ICD-10-CM

## 2016-09-11 NOTE — Therapy (Signed)
[facility] [address] [place], [place], [address] Phone: [phone]   Fax:  [phone]  Physical Therapy Treatment  Patient Details  Name: Denise Crane MRN: [id number] Date of Birth: 07-24-1966 Referring Provider: Dr. Toni ArthursJohn Hewitt  Encounter Date: 09/11/2016      PT End of Session - 09/11/16 1300    Visit Number 4   Number of Visits 12   Date for PT Re-Evaluation 10/02/16   Authorization Type Medicare   PT Start Time 1145   PT Stop Time 1227   PT Time Calculation (min) 42 min   Activity Tolerance Patient tolerated treatment well   Behavior During Therapy [facility] for tasks assessed/performed      Past Medical History:  Diagnosis Date  . Antiphospholipid syndrome (HCC)   . Anxiety   . CHF (congestive heart failure) (HCC)   . Depression   . Endometriosis   . Headache   . Hypertension   . PE (pulmonary embolism)    2011  . Pleural effusion, right    right pleural effusion s/p VATS/pleurodesis '13   . Stroke [facility](HCC)     Past Surgical History:  Procedure Laterality Date  . ABDOMINAL HYSTERECTOMY    . ACHILLES TENDON LENGTHENING Left 06/21/2016   Procedure: LEFT ACHILLES LENGTHENING AND LEFT POSTIER TIBALIAS TRANSFER TO LATERAL CUNEIFORM;  Surgeon: Toni ArthursJohn Hewitt, MD;  [facility];  Service: Orthopedics;  Laterality: Left;  . APPENDECTOMY    . PLEURAL BIOPSY      There were no vitals filed for this visit.      Subjective Assessment - 09/11/16 1147    Subjective new exercises going well; doing seated ROM as much as she can   Patient Stated Goals out of boot, walk, wear normal shoes, no longer use AFO, drive eventually (no driving since CVA)   Currently in Pain? No/denies                         [facility] Treatment/Exercise - 09/11/16 1153      Ambulation/Gait   Ambulation/Gait Yes   Ambulation/Gait Assistance 6: Modified independent (Device/Increase time)   Ambulation Distance  (Feet) 500 Feet   Assistive device Straight cane   Gait Pattern Decreased stance time - left;Decreased step length - right;Abducted - left;Decreased hip/knee flexion - left;Decreased weight shift to left   Ambulation Surface Level;Unlevel;Indoor;Outdoor;Paved;Grass   Gait Comments utilized ASO for amb with improved foot clearance and ankle stability.  Pt may not need leather toe cap with ASO if pt agreeable.       Self-Care   Self-Care Other Self-Care Comments   Other Self-Care Comments  trialed donning/doffing ASO with elastic laces; min A for donning but feel pt will be able to donn independently with practice     Ankle Exercises: Aerobic   Stationary Bike NuStep L5 x 8 min; cues to keep L heel in contact with step     Ankle Exercises: Stretches   Soleus Stretch 3 reps;30 seconds  standing   Gastroc Stretch 3 reps;30 seconds   Gastroc Stretch Limitations standing                  PT Short Term Goals - 09/11/16 1301      PT SHORT TERM GOAL #1   Title independent with HEP (09/07/16)   Status Achieved           PT Long Term Goals - 08/21/16 1309  PT LONG TERM GOAL #1   Title demonstrate 4 degrees active dorsiflexion past neutral for improved ankle mobility (10/02/16)   Time 6   Period Weeks   Status New     PT LONG TERM GOAL #2   Title ambulate without boot with supervision and SPC > 150' for improved function (10/02/16)   Time 6   Period Weeks   Status New     PT LONG TERM GOAL #3   Title assess need for AFO once pt able to amb without boot (10/02/16)   Time 6   Period Weeks   Status New               Plan - 09/11/16 1301    Clinical Impression Statement Trialed ASO today for lateral ankle stability and pt ale to ambulate mod I with SPC on level and compliant surfaces.  Needs min A currently to donn with elastic laces but feel with practice pt can be mod I to donn.  May not need toe cap as pt will good foot clearance.   PT Treatment/Interventions  ADLs/Self Care Home Management;Electrical Stimulation;Cryotherapy;Moist Heat;Ultrasound;Neuromuscular re-education;Balance training;Therapeutic exercise;Therapeutic activities;Functional mobility training;Stair training;Gait training;Patient/family education;Manual techniques;Scar mobilization;Passive range of motion   PT Next Visit Plan standing exercises to improve ankle stability, donn/doff ASO   Consulted and Agree with Plan of Care Patient      Patient will benefit from skilled therapeutic intervention in order to improve the following deficits and impairments:  Abnormal gait, Decreased scar mobility, Decreased strength, Decreased range of motion, Difficulty walking, Decreased activity tolerance  Visit Diagnosis: Stiffness of left ankle, not elsewhere classified  Muscle weakness (generalized)  Other abnormalities of gait and mobility     Problem List Patient Active Problem List   Diagnosis Date Noted  . Adjustment disorder with disturbance of emotion 07/01/2016      Clarita CraneStephanie F Elisavet Buehrer, PT, DPT 09/11/16 1:03 PM    [place] [facility] [address] [place], [place], [address] Phone: [phone]   Fax:  [phone]  Name: Denise Crane MRN: [id number] Date of Birth: [birth year]

## 2016-09-13 ENCOUNTER — Ambulatory Visit: Payer: BLUE CROSS/BLUE SHIELD | Admitting: Physical Therapy

## 2016-09-13 DIAGNOSIS — M6281 Muscle weakness (generalized): Secondary | ICD-10-CM

## 2016-09-13 DIAGNOSIS — M25672 Stiffness of left ankle, not elsewhere classified: Secondary | ICD-10-CM | POA: Diagnosis not present

## 2016-09-13 DIAGNOSIS — R2689 Other abnormalities of gait and mobility: Secondary | ICD-10-CM

## 2016-09-13 NOTE — Therapy (Signed)
[place] [address] [place], [place], [address] Phone: [phone]   Fax:  [phone]  Physical Therapy Treatment  Patient Details  Name: Denise Crane MRN: [id number] Date of Birth: 02-03-1966 Referring Provider: Dr. Wylene Simmer  Encounter Date: 09/13/2016      PT End of Session - 09/13/16 1151    Visit Number 5   Number of Visits 12   Date for PT Re-Evaluation 10/02/16   Authorization Type Medicare   PT Start Time 1054   PT Stop Time 1133   PT Time Calculation (min) 39 min   Activity Tolerance Patient tolerated treatment well   Behavior During Therapy [facility] for tasks assessed/performed      Past Medical History:  Diagnosis Date  . Antiphospholipid syndrome ([place])   . Anxiety   . CHF (congestive heart failure) ([place])   . Depression   . Endometriosis   . Headache   . Hypertension   . PE (pulmonary embolism)    2011  . Pleural effusion, right    right pleural effusion s/p VATS/pleurodesis '13   . Stroke [facility])     Past Surgical History:  Procedure Laterality Date  . ABDOMINAL HYSTERECTOMY    . ACHILLES TENDON LENGTHENING Left 06/21/2016   Procedure: LEFT ACHILLES LENGTHENING AND LEFT POSTIER TIBALIAS TRANSFER TO LATERAL CUNEIFORM;  Surgeon: Wylene Simmer, MD;  Location: [facility];  Service: Orthopedics;  Laterality: Left;  . APPENDECTOMY    . PLEURAL BIOPSY      There were no vitals filed for this visit.          [facility] PT Assessment - 09/13/16 1123      AROM   Left Ankle Dorsiflexion 4  past neutral     PROM   Left Ankle Dorsiflexion 17  past neutral; 12 in standing                     [facility] Adult PT Treatment/Exercise - 09/13/16 1146      Ambulation/Gait   Ambulation/Gait Yes   Ambulation/Gait Assistance 6: Modified independent (Device/Increase time)   Ambulation Distance (Feet) 350 Feet   Assistive device Straight cane;None   Gait Pattern Decreased stance time -  left;Decreased step length - right;Abducted - left;Decreased hip/knee flexion - left;Decreased weight shift to left   Ambulation Surface Level;Indoor   Ramp 6: Modified independent (Device)  SPC   Curb 5: Supervision  SPC with cues     Therapeutic Activites    Therapeutic Activities ADL's   ADL's donn/doff ASO with elastic laces-pt required supervision for sequencing and min cues for technique but feel pt will learn quickly how to donn/doff independently                PT Education - 09/13/16 1149    Education provided Yes   Education Details donning/doffing ASO with elastic laces   Person(s) Educated Patient   Methods Explanation;Demonstration   Comprehension Verbalized understanding;Returned demonstration          PT Short Term Goals - 09/11/16 1301      PT SHORT TERM GOAL #1   Title independent with HEP (09/07/16)   Status Achieved           PT Long Term Goals - 09/13/16 1151      PT LONG TERM GOAL #1   Title demonstrate 4 degrees active dorsiflexion past neutral for improved ankle mobility (10/02/16)   Status Achieved     PT LONG TERM  GOAL #2   Title ambulate without boot with supervision and SPC > 150' for improved function (10/02/16)   Status Achieved     PT LONG TERM GOAL #3   Title assess need for AFO once pt able to amb without boot (10/02/16)   Status Achieved               Plan - 09/13/16 1151    Clinical Impression Statement At this time pt has met all LTGs and is ambulating safely with ASO without brace with adequate foot clearance.  Feel pt will be ready for d/c, but at this time doesn't have her own ASO.  Recommended pt keep next scheduled appointment, but needs to obtain own brace.  She is to cancel appointment if she can get brace on/off by herself without difficulties but will return for one final session if she has trouble doing this on her own.  Pt verbalized understanding.   PT Treatment/Interventions ADLs/Self Care Home  Management;Electrical Stimulation;Cryotherapy;Moist Heat;Ultrasound;Neuromuscular re-education;Balance training;Therapeutic exercise;Therapeutic activities;Functional mobility training;Stair training;Gait training;Patient/family education;Manual techniques;Scar mobilization;Passive range of motion   PT Next Visit Plan continue practice with ASO if needed   Consulted and Agree with Plan of Care Patient      Patient will benefit from skilled therapeutic intervention in order to improve the following deficits and impairments:  Abnormal gait, Decreased scar mobility, Decreased strength, Decreased range of motion, Difficulty walking, Decreased activity tolerance  Visit Diagnosis: Stiffness of left ankle, not elsewhere classified  Muscle weakness (generalized)  Other abnormalities of gait and mobility     Problem List Patient Active Problem List   Diagnosis Date Noted  . Adjustment disorder with disturbance of emotion 07/01/2016       Laureen Abrahams, PT, DPT 09/13/16 11:57 AM    [place] [address] [place], [place], [address] Phone: [phone]   Fax:  [phone]  Name: Denise Crane MRN: [id number] Date of Birth: August 29, 1966

## 2016-09-18 ENCOUNTER — Ambulatory Visit: Payer: Self-pay | Admitting: Physical Therapy

## 2016-09-21 ENCOUNTER — Ambulatory Visit: Payer: BLUE CROSS/BLUE SHIELD | Admitting: Physical Therapy

## 2016-09-21 DIAGNOSIS — M25672 Stiffness of left ankle, not elsewhere classified: Secondary | ICD-10-CM | POA: Diagnosis not present

## 2016-09-21 DIAGNOSIS — R2689 Other abnormalities of gait and mobility: Secondary | ICD-10-CM

## 2016-09-21 DIAGNOSIS — M6281 Muscle weakness (generalized): Secondary | ICD-10-CM

## 2016-09-21 NOTE — Therapy (Signed)
[facility] [address] [place], [place], [address] Phone: [phone]   Fax:  [phone]  Physical Therapy Treatment  Patient Details  Name: Denise Crane MRN: [id number] Date of Birth: 07/05/1966 Referring Provider: Dr. Wylene Simmer  Encounter Date: 09/21/2016      PT End of Session - 09/21/16 1125    Visit Number 6   PT Start Time 9518   PT Stop Time 8416  d/c visit   PT Time Calculation (min) 26 min   Activity Tolerance Patient tolerated treatment well   Behavior During Therapy [facility] for tasks assessed/performed      Past Medical History:  Diagnosis Date  . Antiphospholipid syndrome ([place])   . Anxiety   . CHF (congestive heart failure) ([place])   . Depression   . Endometriosis   . Headache   . Hypertension   . PE (pulmonary embolism)    2011  . Pleural effusion, right    right pleural effusion s/p VATS/pleurodesis '13   . Stroke [facility])     Past Surgical History:  Procedure Laterality Date  . ABDOMINAL HYSTERECTOMY    . ACHILLES TENDON LENGTHENING Left 06/21/2016   Procedure: LEFT ACHILLES LENGTHENING AND LEFT POSTIER TIBALIAS TRANSFER TO LATERAL CUNEIFORM;  Surgeon: Wylene Simmer, MD;  Location: [facility];  Service: Orthopedics;  Laterality: Left;  . APPENDECTOMY    . PLEURAL BIOPSY      There were no vitals filed for this visit.      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 No/denies               PT session: Session focused solely on ASO application and use.  PT adjusted ASO and replaced regular laces with elastic laces and fitted properly to pt (left top 2 holes open).  Pt practiced donning/doffing x multiple reps with min A  initially progressing to supervision.  Pt independent with velcro straps.  Discussed use of 2-sided tape to hold tongue and top velcro strap in place for better fit.  Pt agreeable and will use if needed.    No further PT needs at this time.                    PT Short Term Goals - 09/11/16 1301      PT SHORT TERM GOAL #1   Title independent with HEP (09/07/16)   Status Achieved           PT Long Term Goals - 09/13/16 1151      PT LONG TERM GOAL #1   Title demonstrate 4 degrees active dorsiflexion past neutral for improved ankle mobility (10/02/16)   Status Achieved     PT LONG TERM GOAL #2   Title ambulate without boot with supervision and SPC > 150' for improved function (10/02/16)   Status Achieved     PT LONG TERM GOAL #3   Title assess need for AFO once pt able to amb without boot (10/02/16)   Status Achieved               Plan - 09/21/16 1125    Clinical Impression Statement Session today focused on fitting ASO appropriately for  pt to donn/doff independently.  All goals met and ready for d/c.   PT Treatment/Interventions ADLs/Self Care Home Management;Electrical Stimulation;Cryotherapy;Moist Heat;Ultrasound;Neuromuscular re-education;Balance training;Therapeutic exercise;Therapeutic activities;Functional mobility training;Stair training;Gait training;Patient/family education;Manual techniques;Scar mobilization;Passive range of motion   PT Next Visit Plan d/c PT today   Consulted and Agree with Plan of Care Patient      Patient will benefit from skilled therapeutic intervention in order to improve the following deficits and impairments:  Abnormal gait, Decreased scar mobility, Decreased strength, Decreased range of motion, Difficulty walking, Decreased activity tolerance  Visit Diagnosis: Stiffness of left ankle, not elsewhere classified  Muscle weakness (generalized)  Other abnormalities of gait and mobility       G-Codes - 24-Sep-2016 1126     Functional Assessment Tool Used clinical judgement   Functional Limitation Mobility: Walking and moving around   Mobility: Walking and Moving Around Goal Status [phone]) At least 40 percent but less than 60 percent impaired, limited or restricted   Mobility: Walking and Moving Around Discharge Status [phone]) At least 40 percent but less than 60 percent impaired, limited or restricted      Problem List Patient Active Problem List   Diagnosis Date Noted  . Adjustment disorder with disturbance of emotion 07/01/2016      Laureen Abrahams, PT, DPT 09-24-2016 11:29 AM    [facility] [address] [place], [place], [address] Phone: [phone]   Fax:  [phone]  Name: Tawsha Terrero MRN: [id number] Date of Birth: 16-Dec-1965       PHYSICAL THERAPY DISCHARGE SUMMARY  Visits from Start of Care: 6  Current functional level related to goals / functional outcomes: See above; all goals met   Remaining deficits: Residual L sided weakness from prior CVA   Education / Equipment: HEP, ASO with elastic laces  Plan: Patient agrees to discharge.  Patient goals were met. Patient is being discharged due to meeting the stated rehab goals.  ?????    Laureen Abrahams, PT, DPT Sep 24, 2016 11:31 AM  [facility] [address] [place], [place] [address]  [phone] (office) [phone] (fax)

## 2017-02-11 ENCOUNTER — Telehealth: Payer: Self-pay

## 2017-02-11 NOTE — Telephone Encounter (Signed)
Sent notes to scheduling 

## 2017-04-10 ENCOUNTER — Other Ambulatory Visit: Payer: Self-pay | Admitting: Family Medicine

## 2017-04-10 ENCOUNTER — Other Ambulatory Visit: Payer: Self-pay | Admitting: Internal Medicine

## 2017-04-10 DIAGNOSIS — Z1231 Encounter for screening mammogram for malignant neoplasm of breast: Secondary | ICD-10-CM

## 2017-04-30 ENCOUNTER — Ambulatory Visit
Admission: RE | Admit: 2017-04-30 | Discharge: 2017-04-30 | Disposition: A | Discharge status: Home / Self Care | Payer: BLUE CROSS/BLUE SHIELD | Source: Ambulatory Visit | Attending: Internal Medicine | Admitting: Internal Medicine

## 2017-04-30 DIAGNOSIS — Z1231 Encounter for screening mammogram for malignant neoplasm of breast: Secondary | ICD-10-CM

## 2017-07-28 ENCOUNTER — Encounter (HOSPITAL_COMMUNITY): Payer: Self-pay

## 2017-07-28 ENCOUNTER — Emergency Department (HOSPITAL_COMMUNITY): Payer: Medicare Other

## 2017-07-28 ENCOUNTER — Emergency Department (HOSPITAL_COMMUNITY)
Admission: EM | Admit: 2017-07-28 | Discharge: 2017-07-28 | Disposition: A | Discharge status: Home / Self Care | Payer: Medicare Other | Attending: Emergency Medicine | Admitting: Emergency Medicine

## 2017-07-28 DIAGNOSIS — R5383 Other fatigue: Secondary | ICD-10-CM | POA: Diagnosis not present

## 2017-07-28 DIAGNOSIS — R42 Dizziness and giddiness: Secondary | ICD-10-CM | POA: Diagnosis present

## 2017-07-28 DIAGNOSIS — I509 Heart failure, unspecified: Secondary | ICD-10-CM | POA: Diagnosis not present

## 2017-07-28 DIAGNOSIS — Z91040 Latex allergy status: Secondary | ICD-10-CM | POA: Insufficient documentation

## 2017-07-28 DIAGNOSIS — Z79899 Other long term (current) drug therapy: Secondary | ICD-10-CM | POA: Diagnosis not present

## 2017-07-28 DIAGNOSIS — Z7901 Long term (current) use of anticoagulants: Secondary | ICD-10-CM | POA: Diagnosis not present

## 2017-07-28 DIAGNOSIS — Z86711 Personal history of pulmonary embolism: Secondary | ICD-10-CM | POA: Insufficient documentation

## 2017-07-28 DIAGNOSIS — Z8673 Personal history of transient ischemic attack (TIA), and cerebral infarction without residual deficits: Secondary | ICD-10-CM | POA: Insufficient documentation

## 2017-07-28 DIAGNOSIS — R531 Weakness: Secondary | ICD-10-CM

## 2017-07-28 DIAGNOSIS — Z7982 Long term (current) use of aspirin: Secondary | ICD-10-CM | POA: Insufficient documentation

## 2017-07-28 DIAGNOSIS — I110 Hypertensive heart disease with heart failure: Secondary | ICD-10-CM | POA: Diagnosis not present

## 2017-07-28 LAB — CBG MONITORING, ED: GLUCOSE-CAPILLARY: 119 mg/dL — AB (ref 65–99)

## 2017-07-28 LAB — URINALYSIS, ROUTINE W REFLEX MICROSCOPIC
BILIRUBIN URINE: NEGATIVE
GLUCOSE, UA: NEGATIVE mg/dL
Hgb urine dipstick: NEGATIVE
Ketones, ur: NEGATIVE mg/dL
Nitrite: NEGATIVE
PH: 6 (ref 5.0–8.0)
Protein, ur: NEGATIVE mg/dL
SPECIFIC GRAVITY, URINE: 1.013 (ref 1.005–1.030)

## 2017-07-28 LAB — CBC
HEMATOCRIT: 32.8 % — AB (ref 36.0–46.0)
Hemoglobin: 10.1 g/dL — ABNORMAL LOW (ref 12.0–15.0)
MCH: 23 pg — ABNORMAL LOW (ref 26.0–34.0)
MCHC: 30.8 g/dL (ref 30.0–36.0)
MCV: 74.5 fL — AB (ref 78.0–100.0)
Platelets: 289 10*3/uL (ref 150–400)
RBC: 4.4 MIL/uL (ref 3.87–5.11)
RDW: 18.5 % — ABNORMAL HIGH (ref 11.5–15.5)
WBC: 7.3 10*3/uL (ref 4.0–10.5)

## 2017-07-28 LAB — I-STAT CHEM 8, ED
BUN: 12 mg/dL (ref 6–20)
CHLORIDE: 109 mmol/L (ref 101–111)
CREATININE: 0.9 mg/dL (ref 0.44–1.00)
Calcium, Ion: 1.09 mmol/L — ABNORMAL LOW (ref 1.15–1.40)
Glucose, Bld: 113 mg/dL — ABNORMAL HIGH (ref 65–99)
HEMATOCRIT: 35 % — AB (ref 36.0–46.0)
Hemoglobin: 11.9 g/dL — ABNORMAL LOW (ref 12.0–15.0)
POTASSIUM: 3.6 mmol/L (ref 3.5–5.1)
SODIUM: 145 mmol/L (ref 135–145)
TCO2: 24 mmol/L (ref 22–32)

## 2017-07-28 LAB — APTT: APTT: 35 s (ref 24–36)

## 2017-07-28 LAB — COMPREHENSIVE METABOLIC PANEL
ALBUMIN: 3.5 g/dL (ref 3.5–5.0)
ALT: 18 U/L (ref 14–54)
AST: 19 U/L (ref 15–41)
Alkaline Phosphatase: 71 U/L (ref 38–126)
Anion gap: 9 (ref 5–15)
BUN: 11 mg/dL (ref 6–20)
CHLORIDE: 108 mmol/L (ref 101–111)
CO2: 24 mmol/L (ref 22–32)
CREATININE: 0.9 mg/dL (ref 0.44–1.00)
Calcium: 8.7 mg/dL — ABNORMAL LOW (ref 8.9–10.3)
GFR calc Af Amer: >60 mL/min (ref >60)
GLUCOSE: 109 mg/dL — AB (ref 65–99)
POTASSIUM: 3.6 mmol/L (ref 3.5–5.1)
Sodium: 141 mmol/L (ref 135–145)
Total Bilirubin: 0.4 mg/dL (ref 0.3–1.2)
Total Protein: 7 g/dL (ref 6.5–8.1)

## 2017-07-28 LAB — DIFFERENTIAL
BASOS ABS: 0 10*3/uL (ref 0.0–0.1)
BASOS PCT: 0 %
EOS ABS: 0.1 10*3/uL (ref 0.0–0.7)
EOS PCT: 2 %
LYMPHS PCT: 27 %
Lymphs Abs: 2 10*3/uL (ref 0.7–4.0)
MONO ABS: 0.4 10*3/uL (ref 0.1–1.0)
Monocytes Relative: 6 %
Neutro Abs: 4.8 10*3/uL (ref 1.7–7.7)
Neutrophils Relative %: 65 %

## 2017-07-28 LAB — PROTIME-INR
INR: 2.2
Prothrombin Time: 24.3 seconds — ABNORMAL HIGH (ref 11.4–15.2)

## 2017-07-28 NOTE — ED Triage Notes (Signed)
Pt from [place] via EMS with dizziness and lethargy since approx 1130. Pt with hx of TIA and CVA in 2010 and 2011 with resulting L sided weakness. Pt reports current sx have since resolved and she has noticed no worsening weakness. Dr. Rush Landmarkegeler aware. Pt A&Ox4. Denies numbness/tingling, weakness. EMS VS: 12 lead normal, 135/86, 83 HR, 18 RR, 97% on RA, 110 CBG, clear breath sounds. 20 G L hand, 250 cc given en route. IV access lost during transport.

## 2017-07-28 NOTE — ED Provider Notes (Signed)
[facility] EMERGENCY DEPARTMENT Provider Note   CSN: [id number] Arrival date & time: 07/28/17  1443     History   Chief Complaint Chief Complaint  Patient presents with  . Fatigue  . Dizziness    HPI Denise Crane is a 51 y.o. female.  HPI Denise Crane is a 51 y.o. female with history of CHF, CVA with left-sided weakness, pulmonary embolism, on Coumadin, presents to emergency department complaining of sudden onset of weakness which now improved.  Patient states she was feeling well this morning.  She states she had normal breakfast, went to church.  While at church she felt like she suddenly got weak and wanted to go to sleep.  She reports lightheadedness, denies vertigo-like symptoms.  She states "I just felt tired and felt like I just needed to go and lay down and get some rest."  Patient states "I felt lethargic."  Patient denies any associated chest pain or shortness of breath.  No nausea or vomiting.  No abdominal pain.  No urinary symptoms.  She does have history of UTIs.  No new numbness or weakness in extremities.  No headache.  She states she did wake up extraorally today, states woke up at 5 am.  Parents brought patient to the ED for evaluation for this new fatigue.  She denies any changes in her medications recently.  She denies any URI symptoms.  No fever or chills.  Nothing making her symptoms better or worse.  Although she also states that now she has been much better.  Past Medical History:  Diagnosis Date  . Antiphospholipid syndrome ([facility])   . Anxiety   . CHF (congestive heart failure) ([facility])   . Depression   . Endometriosis   . Headache   . Hypertension   . PE (pulmonary embolism)    2011  . Pleural effusion, right    right pleural effusion s/p VATS/pleurodesis '13   . Stroke [facility])     Patient Active Problem List   Diagnosis Date Noted  . Adjustment disorder with disturbance of emotion 07/01/2016    Past Surgical History:  Procedure  Laterality Date  . ABDOMINAL HYSTERECTOMY    . APPENDECTOMY    . PLEURAL BIOPSY      OB History    No data available       Home Medications    Prior to Admission medications   Medication Sig Start Date End Date Taking? Authorizing Provider  acetaminophen (TYLENOL) 500 MG tablet Take 500 mg by mouth every 6 (six) hours as needed for moderate pain or headache.    [provider]  amantadine (SYMMETREL) 100 MG capsule Take 100 mg by mouth every morning.  06/13/16 06/13/17  [provider]  aspirin EC 81 MG tablet Take 81 mg by mouth at bedtime.  02/14/10   [provider]  baclofen (LIORESAL) 10 MG tablet Take 10-20 mg by mouth 3 (three) times daily as needed for muscle spasms.  05/17/16   [provider]  carvedilol (COREG) 12.5 MG tablet Take 12.5 mg by mouth 2 (two) times daily. 06/07/16   [provider]  DULoxetine (CYMBALTA) 20 MG capsule Take 40 mg by mouth at bedtime. 06/07/16   [provider]  HYDROcodone-acetaminophen (NORCO) 5-325 MG tablet Take 1-2 tablets by mouth every 6 (six) hours as needed for moderate pain. Patient not taking: Reported on 08/21/2016 06/21/16   Toni ArthursHewitt, John, MD  losartan (COZAAR) 50 MG tablet Take 25 mg by mouth  at bedtime.  04/23/16   [provider]  pantoprazole (PROTONIX) 40 MG tablet Take 40 mg by mouth 2 (two) times daily. 01/24/15   [provider]  polyethylene glycol (MIRALAX / GLYCOLAX) packet Take 17 g by mouth daily as needed for mild constipation.     [provider]  warfarin (COUMADIN) 5 MG tablet Take 5 mg by mouth every evening.  06/07/16   [provider]    Family History Family History  Problem Relation Age of Onset  . Breast cancer Neg Hx     Social History Social History   Tobacco Use  . Smoking status: Never Smoker  . Smokeless tobacco: Never Used  Substance Use Topics  . Alcohol use: No  . Drug use: No     Allergies   Ace inhibitors;  Benoxyl [benzoyl peroxide]; and Latex   Review of Systems Review of Systems  Constitutional: Positive for fatigue. Negative for chills and fever.  Respiratory: Negative for cough, chest tightness and shortness of breath.   Cardiovascular: Negative for chest pain, palpitations and leg swelling.  Gastrointestinal: Negative for abdominal pain, diarrhea, nausea and vomiting.  Genitourinary: Negative for dysuria, flank pain and pelvic pain.  Musculoskeletal: Negative for arthralgias, myalgias, neck pain and neck stiffness.  Skin: Negative for rash.  Neurological: Positive for dizziness, weakness and light-headedness. Negative for headaches.  All other systems reviewed and are negative.    Physical Exam Updated Vital Signs Pulse 74   Temp 98.1 F (36.7 C) (Oral)   Resp (!) 21   Ht 5\' 4"  (1.626 m)   Wt 79.4 kg (175 lb)   SpO2 98%   BMI 30.04 kg/m   Physical Exam  Constitutional: She appears well-developed and well-nourished. No distress.  HENT:  Head: Normocephalic.  Eyes: Conjunctivae are normal.  Neck: Neck supple.  Cardiovascular: Normal rate, regular rhythm and normal heart sounds.  Pulmonary/Chest: Effort normal and breath sounds normal. No respiratory distress. She has no wheezes. She has no rales.  Abdominal: Soft. Bowel sounds are normal. She exhibits no distension. There is no tenderness. There is no rebound.  Musculoskeletal: She exhibits no edema.  Neurological: She is alert.  Left arm contracted, unable to grip.  She is able to slightly raise her arm of the stretcher.  5 out of 5 strength to the right arm and grip.  5 out of 5 strength to the right leg with hip flexion, knee extension, plantarflexion and dorsiflexion of the foot.  5 out of 5 strength with the left hip flexion.  4 out of 5 strength with knee extension and foot plantar and dorsiflexion.  Sensation intact in all extremities.  Distal radial pulses intact and equal bilaterally in all extremities.  Skin: Skin  is warm and dry.  Psychiatric: She has a normal mood and affect. Her behavior is normal.  Nursing note and vitals reviewed.    ED Treatments / Results  Labs (all labs ordered are listed, but only abnormal results are displayed) Labs Reviewed  PROTIME-INR - Abnormal; Notable for the following components:      Result Value   Prothrombin Time 24.3 (*)    All other components within normal limits  CBC - Abnormal; Notable for the following components:   Hemoglobin 10.1 (*)    HCT 32.8 (*)    MCV 74.5 (*)    MCH 23.0 (*)    RDW 18.5 (*)    All other components within normal limits  COMPREHENSIVE METABOLIC PANEL -  Abnormal; Notable for the following components:   Glucose, Bld 109 (*)    Calcium 8.7 (*)    All other components within normal limits  URINALYSIS, ROUTINE W REFLEX MICROSCOPIC - Abnormal; Notable for the following components:   Color, Urine STRAW (*)    Leukocytes, UA SMALL (*)    Bacteria, UA RARE (*)    Squamous Epithelial / LPF 0-5 (*)    All other components within normal limits  CBG MONITORING, ED - Abnormal; Notable for the following components:   Glucose-Capillary 119 (*)    All other components within normal limits  I-STAT CHEM 8, ED - Abnormal; Notable for the following components:   Glucose, Bld 113 (*)    Calcium, Ion 1.09 (*)    Hemoglobin 11.9 (*)    HCT 35.0 (*)    All other components within normal limits  URINE CULTURE  APTT  DIFFERENTIAL  I-STAT TROPONIN, ED    EKG  EKG Interpretation  Date/Time:  Sunday July 28 2017 15:02:48 EST Ventricular Rate:  78 PR Interval:    QRS Duration: 78 QT Interval:  375 QTC Calculation: 428 R Axis:   6 Text Interpretation:  Sinus rhythm RSR' in V1 or V2, probably normal variant Borderline T wave abnormalities No old tracing to compare Confirmed by Pricilla Loveless [phone]) on 07/28/2017 3:10:26 PM       Radiology Ct Head Wo Contrast  Result Date: 07/28/2017 CLINICAL DATA:  Pt c/o dizziness and  lethargy since approx 1130. Pt with hx of TIA and CVA in 2010 and 2011 with resulting L sided weakness EXAM: CT HEAD WITHOUT CONTRAST TECHNIQUE: Contiguous axial images were obtained from the base of the skull through the vertex without intravenous contrast. COMPARISON:  None. FINDINGS: Brain: Large old right middle cerebral artery distribution infarct. Old left parietooccipital lobe infarct. Small lacune infarct in the left caudate nucleus head. There are no parenchymal masses or mass effect. There is no evidence of a recent infarct. No hydrocephalus. No intracranial hemorrhage. Vascular: No hyperdense vessel or unexpected calcification. Skull: Normal. Negative for fracture or focal lesion. Sinuses/Orbits: Globes and orbits are unremarkable. Visualized sinuses and mastoid air cells are clear. Other: None. IMPRESSION: 1. No acute intracranial abnormalities. 2. Large old right middle cerebral artery infarction. Smaller left posterior parietal/occipital lobe infarct. Electronically Signed   By: Amie Portland M.D.   On: 07/28/2017 17:10    Procedures Procedures (including critical care time)  Medications Ordered in ED Medications - No data to display   Initial Impression / Assessment and Plan / ED Course  I have reviewed the triage vital signs and the nursing notes.  Pertinent labs & imaging results that were available during my care of the patient were reviewed by me and considered in my medical decision making (see chart for details).     Patient with generalized weakness onset while at church.  Exam unremarkable other than chronic left arm weakness.  Will get labs, orthostatic vital signs, urinalysis.  6:01 PM Labs unremarkable.  INR is 2.2.  EKG does not show any new findings.  CT scan with no acute findings either.  Patient is not orthostatic.  She is well-appearing with no acute neurological findings.  Discussed her symptoms and results with her and her family.  Patient is comfortable going  home.  Advised to get good rest tonight, eat well-balanced meals, drink plenty of fluids.  If still not feeling well tomorrow to follow-up with her doctor.  Return precautions discussed.  Vital signs normal, patient discharged in stable condition.  Vitals:   07/28/17 1530 07/28/17 1600 07/28/17 1700 07/28/17 1730  BP: 130/85 122/81 (!) 130/92 (!) 128/104  Pulse: 81 80 82 77  Resp: 15 (!) 23 (!) 21 18  Temp:      TempSrc:      SpO2: 100% 100% 98% 98%  Weight:      Height:         Final Clinical Impressions(s) / ED Diagnoses   Final diagnoses:  Weakness    New Prescriptions This SmartLink is deprecated. Use AVSMEDLIST instead to display the medication list for a patient.   Jaynie Crumble, PA-C 07/28/17 1803    Pricilla Loveless, MD 07/29/17 [id number]

## 2017-07-28 NOTE — ED Notes (Signed)
Pt verbalized understanding discharge instructions and denies any further needs or questions at this time. VS stable, ambulatory and steady gait.   

## 2017-07-28 NOTE — Discharge Instructions (Signed)
Rest.  Drink plenty of fluids and eat well-balanced meals.  Follow-up with your family doctor if not improving tomorrow.  Return if any worsening symptoms.

## 2017-07-28 NOTE — ED Notes (Signed)
Delay in lab draw,  Provider at bedside. 

## 2017-07-28 NOTE — ED Notes (Signed)
Pt is irritated. Pt has removed all monitoring leads and is refusing being monitored. Pt stated she believes she is allergic to all monitor adhesives. RN and MD notified.

## 2017-07-29 LAB — URINE CULTURE

## 2018-05-07 ENCOUNTER — Other Ambulatory Visit: Payer: Self-pay | Admitting: Internal Medicine

## 2018-05-07 ENCOUNTER — Other Ambulatory Visit: Payer: Self-pay | Admitting: Physician Assistant

## 2018-05-07 DIAGNOSIS — Z1231 Encounter for screening mammogram for malignant neoplasm of breast: Secondary | ICD-10-CM

## 2018-06-02 ENCOUNTER — Ambulatory Visit
Admission: RE | Admit: 2018-06-02 | Discharge: 2018-06-02 | Disposition: A | Discharge status: Home / Self Care | Payer: 59 | Source: Ambulatory Visit | Attending: Physician Assistant | Admitting: Physician Assistant

## 2018-06-02 DIAGNOSIS — Z1231 Encounter for screening mammogram for malignant neoplasm of breast: Secondary | ICD-10-CM

## 2018-12-02 ENCOUNTER — Other Ambulatory Visit: Payer: Self-pay

## 2018-12-02 ENCOUNTER — Emergency Department (HOSPITAL_COMMUNITY)
Admission: EM | Admit: 2018-12-02 | Discharge: 2018-12-03 | Disposition: A | Discharge status: Home / Self Care | Payer: 59 | Attending: Emergency Medicine | Admitting: Emergency Medicine

## 2018-12-02 ENCOUNTER — Encounter (HOSPITAL_COMMUNITY): Payer: Self-pay | Admitting: Emergency Medicine

## 2018-12-02 DIAGNOSIS — Z79899 Other long term (current) drug therapy: Secondary | ICD-10-CM | POA: Insufficient documentation

## 2018-12-02 DIAGNOSIS — Z7901 Long term (current) use of anticoagulants: Secondary | ICD-10-CM | POA: Diagnosis not present

## 2018-12-02 DIAGNOSIS — F4329 Adjustment disorder with other symptoms: Secondary | ICD-10-CM

## 2018-12-02 DIAGNOSIS — Z7982 Long term (current) use of aspirin: Secondary | ICD-10-CM | POA: Diagnosis not present

## 2018-12-02 DIAGNOSIS — Z008 Encounter for other general examination: Secondary | ICD-10-CM | POA: Diagnosis not present

## 2018-12-02 DIAGNOSIS — R45851 Suicidal ideations: Secondary | ICD-10-CM | POA: Diagnosis not present

## 2018-12-02 DIAGNOSIS — F329 Major depressive disorder, single episode, unspecified: Secondary | ICD-10-CM | POA: Insufficient documentation

## 2018-12-02 DIAGNOSIS — Z8673 Personal history of transient ischemic attack (TIA), and cerebral infarction without residual deficits: Secondary | ICD-10-CM | POA: Insufficient documentation

## 2018-12-02 DIAGNOSIS — I110 Hypertensive heart disease with heart failure: Secondary | ICD-10-CM | POA: Diagnosis not present

## 2018-12-02 DIAGNOSIS — R4689 Other symptoms and signs involving appearance and behavior: Secondary | ICD-10-CM | POA: Diagnosis present

## 2018-12-02 DIAGNOSIS — Z91040 Latex allergy status: Secondary | ICD-10-CM | POA: Insufficient documentation

## 2018-12-02 DIAGNOSIS — I509 Heart failure, unspecified: Secondary | ICD-10-CM | POA: Insufficient documentation

## 2018-12-02 LAB — RAPID URINE DRUG SCREEN, HOSP PERFORMED
Amphetamines: NOT DETECTED
BARBITURATES: NOT DETECTED
Benzodiazepines: NOT DETECTED
COCAINE: NOT DETECTED
Opiates: NOT DETECTED
Tetrahydrocannabinol: NOT DETECTED

## 2018-12-02 LAB — ETHANOL

## 2018-12-02 LAB — COMPREHENSIVE METABOLIC PANEL
ALT: 18 U/L (ref 0–44)
ANION GAP: 9 (ref 5–15)
AST: 19 U/L (ref 15–41)
Albumin: 3.9 g/dL (ref 3.5–5.0)
Alkaline Phosphatase: 45 U/L (ref 38–126)
BUN: 17 mg/dL (ref 6–20)
CO2: 26 mmol/L (ref 22–32)
Calcium: 9 mg/dL (ref 8.9–10.3)
Chloride: 106 mmol/L (ref 98–111)
Creatinine, Ser: 0.78 mg/dL (ref 0.44–1.00)
Glucose, Bld: 95 mg/dL (ref 70–99)
POTASSIUM: 3.4 mmol/L — AB (ref 3.5–5.1)
Sodium: 141 mmol/L (ref 135–145)
Total Bilirubin: 0.3 mg/dL (ref 0.3–1.2)
Total Protein: 6.8 g/dL (ref 6.5–8.1)

## 2018-12-02 LAB — CBC
HEMATOCRIT: 39.5 % (ref 36.0–46.0)
Hemoglobin: 11.8 g/dL — ABNORMAL LOW (ref 12.0–15.0)
MCH: 27.4 pg (ref 26.0–34.0)
MCHC: 29.9 g/dL — AB (ref 30.0–36.0)
MCV: 91.9 fL (ref 80.0–100.0)
Platelets: 218 10*3/uL (ref 150–400)
RBC: 4.3 MIL/uL (ref 3.87–5.11)
RDW: 13.3 % (ref 11.5–15.5)
WBC: 5 10*3/uL (ref 4.0–10.5)
nRBC: 0 % (ref 0.0–0.2)

## 2018-12-02 LAB — SALICYLATE LEVEL: Salicylate Lvl: <7 mg/dL (ref 2.8–30.0)

## 2018-12-02 LAB — PREGNANCY, URINE: Preg Test, Ur: NEGATIVE

## 2018-12-02 LAB — ACETAMINOPHEN LEVEL

## 2018-12-02 MED ORDER — CARVEDILOL 25 MG PO TABS
25.0000 mg | ORAL_TABLET | Freq: Two times a day (BID) | ORAL | Status: DC
  Administered 2018-12-02 – 2018-12-03 (×2): 25 mg via ORAL
  Filled 2018-12-02 (×3): qty 1

## 2018-12-02 MED ORDER — ASPIRIN EC 81 MG PO TBEC
81.0000 mg | DELAYED_RELEASE_TABLET | Freq: Every day | ORAL | Status: DC
  Administered 2018-12-02: 81 mg via ORAL
  Filled 2018-12-02: qty 1

## 2018-12-02 MED ORDER — ASPIRIN-ACETAMINOPHEN-CAFFEINE 250-250-65 MG PO TABS
2.0000 | ORAL_TABLET | Freq: Three times a day (TID) | ORAL | Status: DC | PRN
  Administered 2018-12-02 – 2018-12-03 (×2): 2 via ORAL
  Filled 2018-12-02 (×3): qty 2

## 2018-12-02 MED ORDER — IRBESARTAN 150 MG PO TABS
150.0000 mg | ORAL_TABLET | Freq: Every day | ORAL | Status: DC
  Administered 2018-12-03: 150 mg via ORAL
  Filled 2018-12-02 (×3): qty 1

## 2018-12-02 MED ORDER — HYDROCHLOROTHIAZIDE 12.5 MG PO CAPS
12.5000 mg | ORAL_CAPSULE | Freq: Every day | ORAL | Status: DC
  Administered 2018-12-02 – 2018-12-03 (×2): 12.5 mg via ORAL
  Filled 2018-12-02 (×3): qty 1

## 2018-12-02 MED ORDER — ESCITALOPRAM OXALATE 10 MG PO TABS
10.0000 mg | ORAL_TABLET | Freq: Every day | ORAL | Status: DC
  Administered 2018-12-02: 10 mg via ORAL
  Filled 2018-12-02: qty 1

## 2018-12-02 MED ORDER — APIXABAN 5 MG PO TABS
5.0000 mg | ORAL_TABLET | Freq: Two times a day (BID) | ORAL | Status: DC
  Administered 2018-12-02 – 2018-12-03 (×2): 5 mg via ORAL
  Filled 2018-12-02 (×2): qty 1

## 2018-12-02 MED ORDER — VALSARTAN-HYDROCHLOROTHIAZIDE 160-12.5 MG PO TABS: 1.0000 | ORAL_TABLET | Freq: Every day | ORAL | Status: DC

## 2018-12-02 MED ORDER — ESCITALOPRAM OXALATE 10 MG PO TABS
5.0000 mg | ORAL_TABLET | Freq: Every day | ORAL | Status: DC
  Administered 2018-12-02: 5 mg via ORAL
  Filled 2018-12-02: qty 1

## 2018-12-02 MED ORDER — DANTROLENE SODIUM 25 MG PO CAPS
25.0000 mg | ORAL_CAPSULE | Freq: Two times a day (BID) | ORAL | Status: DC
  Administered 2018-12-02 – 2018-12-03 (×2): 25 mg via ORAL
  Filled 2018-12-02 (×2): qty 1

## 2018-12-02 NOTE — ED Provider Notes (Signed)
[facility]-EMERGENCY DEPT Provider Note   CSN: [id number] Arrival date & time: 12/02/18  1643    History   Chief Complaint Chief Complaint  Patient presents with  . Suicidal    HPI Denise Crane is a 53 y.o. 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  Patient states that she was sent in by her primary care physician for feeling suicidal and feeling aggressive and angry toward others and wanting to hurt them.  Patient states that she has a previous psychiatric hospitalization in 2017 after her husband to get involuntary commitment paperwork.  She states that she feels extremely depressed because she "just does not want to live this life anymore."  She states that she always threatens to "jump out of the window."  She says the only reason she has not tried to kill herself is because of her niece and nephew and becomes tearful when talking about them.  She states that she frequently feels extremely agitated and thinks about wanting to reach out and choke people when she is angry.  She denies any alcohol or drug abuse.  She denies audiovisual hallucinations     HPI  Past Medical History:  Diagnosis Date  . Antiphospholipid syndrome (HCC)   . Anxiety   . CHF (congestive heart failure) (HCC)   . Depression   . Endometriosis   . Headache   . Hypertension   . PE (pulmonary embolism)    2011  . Pleural effusion, right    right pleural effusion s/p VATS/pleurodesis '13   . Stroke [facility])     Patient Active Problem List   Diagnosis Date Noted  . Adjustment disorder with disturbance of emotion 07/01/2016    Past Surgical History:  Procedure Laterality Date  . ABDOMINAL HYSTERECTOMY    . ACHILLES TENDON LENGTHENING Left 06/21/2016   Procedure: LEFT ACHILLES LENGTHENING AND LEFT POSTIER TIBALIAS TRANSFER TO LATERAL CUNEIFORM;  Surgeon: Toni Arthurs, MD;  Location: [facility] OR;  Service: Orthopedics;   Laterality: Left;  . APPENDECTOMY    . PLEURAL BIOPSY       OB History   No obstetric history on file.      Home Medications    Prior to Admission medications   Medication Sig Start Date End Date Taking? Authorizing Provider  aspirin EC 81 MG tablet Take 81 mg by mouth at bedtime.  02/14/10  Yes [provider]  aspirin-acetaminophen-caffeine (EXCEDRIN MIGRAINE) [phone] MG tablet Take 2 tablets every 6 (six) hours as needed by mouth for headache.   Yes [provider]  carvedilol (COREG) 25 MG tablet Take 25 mg by mouth 2 (two) times daily with a meal. 09/30/18  Yes [provider]  dantrolene (DANTRIUM) 25 MG capsule Take 25 mg by mouth 2 (two) times daily. 10/01/18  Yes [provider]  escitalopram (LEXAPRO) 10 MG tablet Take 10 mg by mouth daily. Take along with 5 mg tablet 11/01/18  Yes [provider]  escitalopram (LEXAPRO) 5 MG tablet Take 5 mg by mouth daily.  11/01/18  Yes [provider]  Multiple Vitamins-Minerals (MULTIVITAMIN ADULTS PO) Take 1 tablet by mouth daily.   Yes [provider]  valsartan-hydrochlorothiazide (DIOVAN-HCT) 160-12.5 MG tablet Take 1 tablet by mouth daily.  09/30/18  Yes [provider]  ELIQUIS 5 MG TABS tablet Take 5 mg by mouth 2 (two) times daily. 11/01/18   [provider]    Family  History Family History  Problem Relation Age of Onset  . Breast cancer Neg Hx     Social History Social History   Tobacco Use  . Smoking status: Never Smoker  . Smokeless tobacco: Never Used  Substance Use Topics  . Alcohol use: No  . Drug use: No     Allergies   Benzoyl peroxide; Latex; and Ace inhibitors   Review of Systems Review of Systems Ten systems reviewed and are negative for acute change, except as noted in the HPI.    Physical Exam Updated Vital Signs BP 118/83 (BP Location: Right Arm)   Pulse 61   Temp 98.2 F (36.8 C) (Oral)   Resp 18   Ht 5\' 4"  (1.626  m)   Wt 76.2 kg   SpO2 100%   BMI 28.84 kg/m   Physical Exam Vitals signs and nursing note reviewed.  Constitutional:      General: She is not in acute distress.    Appearance: She is well-developed. She is not diaphoretic.  HENT:     Head: Normocephalic and atraumatic.  Eyes:     General: No scleral icterus.    Conjunctiva/sclera: Conjunctivae normal.  Neck:     Musculoskeletal: Normal range of motion.  Cardiovascular:     Rate and Rhythm: Normal rate and regular rhythm.     Heart sounds: Normal heart sounds. No murmur. No friction rub. No gallop.   Pulmonary:     Effort: Pulmonary effort is normal. No respiratory distress.     Breath sounds: Normal breath sounds.  Abdominal:     General: Bowel sounds are normal. There is no distension.     Palpations: Abdomen is soft. There is no mass.     Tenderness: There is no abdominal tenderness. There is no guarding.  Skin:    General: Skin is warm and dry.  Neurological:     Mental Status: She is alert and oriented to person, place, and time.     Comments: Left side hemiparesis  Psychiatric:        Behavior: Behavior normal.      ED Treatments / Results  Labs (all labs ordered are listed, but only abnormal results are displayed) Labs Reviewed  COMPREHENSIVE METABOLIC PANEL - Abnormal; Notable for the following components:      Result Value   Potassium 3.4 (*)    All other components within normal limits  ACETAMINOPHEN LEVEL - Abnormal; Notable for the following components:   Acetaminophen (Tylenol), Serum <10 (*)    All other components within normal limits  CBC - Abnormal; Notable for the following components:   Hemoglobin 11.8 (*)    MCHC 29.9 (*)    All other components within normal limits  ETHANOL  SALICYLATE LEVEL  RAPID URINE DRUG SCREEN, HOSP PERFORMED  PREGNANCY, URINE    EKG None  Radiology No results found.  Procedures Procedures (including critical care time)  Medications Ordered in  ED Medications  aspirin EC tablet 81 mg (81 mg Oral Given 12/02/18 2145)  carvedilol (COREG) tablet 25 mg (25 mg Oral Given 12/03/18 0738)  dantrolene (DANTRIUM) capsule 25 mg (25 mg Oral Given 12/03/18 0924)  apixaban (ELIQUIS) tablet 5 mg (5 mg Oral Given 12/03/18 0923)  irbesartan (AVAPRO) tablet 150 mg (150 mg Oral Given 12/03/18 0923)    And  hydrochlorothiazide (MICROZIDE) capsule 12.5 mg (12.5 mg Oral Given 12/03/18 0924)  escitalopram (LEXAPRO) tablet 10 mg (10 mg Oral Given 12/02/18 2304)  escitalopram (LEXAPRO) tablet 5  mg (5 mg Oral Given 12/02/18 2303)  aspirin-acetaminophen-caffeine (EXCEDRIN MIGRAINE) per tablet 2 tablet (2 tablets Oral Given 12/03/18 0923)  zolpidem (AMBIEN) tablet 5 mg (5 mg Oral Given 12/03/18 0227)  menthol-cetylpyridinium (CEPACOL) lozenge 3 mg (3 mg Oral Given 12/03/18 1153)  pantoprazole (PROTONIX) EC tablet 40 mg (40 mg Oral Not Given 12/03/18 3383)     Initial Impression / Assessment and Plan / ED Course  I have reviewed the triage vital signs and the nursing notes.  Pertinent labs & imaging results that were available during my care of the patient were reviewed by me and considered in my medical decision making (see chart for details).        Patient medically clear for psychiatric evaluation  Final Clinical Impressions(s) / ED Diagnoses   Final diagnoses:  Adjustment disorder with disturbance of emotion    ED Discharge Orders         Ordered    Increase activity slowly     12/03/18 1214    Diet - low sodium heart healthy     12/03/18 1214           Arthor Captain, PA-C 12/03/18 1316    Linwood Dibbles, MD 12/03/18 1459

## 2018-12-02 NOTE — ED Notes (Signed)
Denise Sievert, NP, patient meets inpatient criteria for geropsychiatry unit. TTS to seek placement. RN informed of disposition.

## 2018-12-02 NOTE — ED Notes (Signed)
Unsuccessful attempt at bld draw, another RN will attempt.

## 2018-12-02 NOTE — ED Notes (Signed)
Patient states she has been having thoughts of hurting others as well as herself. Pt states no one specific, just those getting on her nerves at the time.
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
## 2018-12-02 NOTE — ED Triage Notes (Signed)
Patient BIB [facility] from Dr. Isidore Moos. Dr office was in process of taking out IVC papers but pt agreed to come to hospital voluntary, so magistrate canceled IVC papers. Dr. Isidore Moos reported to [facility], pt stated she wanted to jump out a 3rd story window. [facility] reports pt has been cooperative.   Dr. Isidore Moos also wanted it noted that her Eliquis was held today bc pt was supposed to get Botox Thursday. Pt no longer getting Botox, so she will need her Eliquis today.   Hx of previous stroke.

## 2018-12-02 NOTE — ED Notes (Signed)
Bed: GO11 Expected date:  Expected time:  Means of arrival:  Comments: Hold for triage
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
## 2018-12-03 ENCOUNTER — Other Ambulatory Visit: Payer: Self-pay

## 2018-12-03 DIAGNOSIS — F329 Major depressive disorder, single episode, unspecified: Secondary | ICD-10-CM | POA: Diagnosis not present

## 2018-12-03 MED ORDER — PANTOPRAZOLE SODIUM 40 MG PO TBEC
40.0000 mg | DELAYED_RELEASE_TABLET | Freq: Two times a day (BID) | ORAL | Status: DC
  Administered 2018-12-03: 40 mg via ORAL
  Filled 2018-12-03: qty 1

## 2018-12-03 MED ORDER — ZOLPIDEM TARTRATE 5 MG PO TABS
5.0000 mg | ORAL_TABLET | Freq: Every evening | ORAL | Status: DC | PRN
  Administered 2018-12-03: 5 mg via ORAL
  Filled 2018-12-03: qty 1

## 2018-12-03 MED ORDER — MENTHOL 3 MG MT LOZG
1.0000 | LOZENGE | OROMUCOSAL | Status: DC | PRN
  Administered 2018-12-03 (×3): 3 mg via ORAL
  Filled 2018-12-03: qty 9

## 2018-12-03 NOTE — BH Assessment (Signed)
[facility] Assessment Progress Note  Per Juanetta Beets, DO, this pt does not require psychiatric hospitalization at this time.  Pt is to be discharged from [facility] with referral information for area therapists.  Discharge instructions include information for the [facility] at [facility], for [facility], and for the [facility].  Pt's nurse has been notified.  Doylene Canning, MA Triage Specialist [phone]

## 2018-12-03 NOTE — BHH Suicide Risk Assessment (Signed)
[facility] Discharge Suicide Risk Assessment   Principal Problem: Adjustment disorder with disturbance of emotion Discharge Diagnoses: Principal Problem:   Adjustment disorder with disturbance of emotion   Total Time spent with patient: 30 minutes   Denise Crane reports struggles with seasonal depression. She reports that she had a "bad day" yesterday which caused agitation. Today, she denies thoughts to harm herself or others. She reports that besides "the old people that get on her nerves" she has no problems at her senior living facility. She has lived there for 18 months. She is independent of her ADLs but she needs assistance with meals. She denies problems with sleep or appetite. She denies alcohol or illicit substance use. She sees a Therapist, sports in [place]. She would like resources to see a therapist.  Musculoskeletal: Strength & Muscle Tone: within normal limits Gait & Station: normal Patient leans: N/A  Psychiatric Specialty Exam: Review of Systems  Psychiatric/Behavioral: Positive for depression. Negative for hallucinations, substance abuse and suicidal ideas. The patient does not have insomnia.   All other systems reviewed and are negative.   Blood pressure 119/84, pulse 69, temperature 97.9 F (36.6 C), temperature source Oral, resp. rate 18, height 5\' 4"  (1.626 m), weight 76.2 kg, SpO2 97 %.Body mass index is 28.84 kg/m.  General Appearance: Fairly Groomed, middle aged, African American female, wearing paper hospital scrubs with a hair net who is lying in bed. NAD.   Eye Contact::  Good  Speech:  Clear and Coherent and Normal Rate  Volume:  Normal  Mood:  Depressed  Affect:  Congruent  Thought Process:  Goal Directed, Linear and Descriptions of Associations: Intact  Orientation:  Full (Time, Place, and Person)  Thought Content:  Logical  Suicidal Thoughts:  No  Homicidal Thoughts:  No  Memory:  Immediate;   Good Recent;   Good Remote;   Good  Judgement:  Fair   Insight:  Fair  Psychomotor Activity:  Normal  Concentration:  Good  Recall:  Good  Fund of Knowledge:Good  Language: Good  Akathisia:  No  Handed:  Right  AIMS (if indicated):   N/A  Assets:  Communication Skills Desire for Improvement Financial Resources/Insurance Housing Resilience Social Support  Sleep:   Okay  Cognition: WNL  ADL's:  Intact   Mental Status Per Nursing Assessment::   On Admission:   "Patient is alert and oriented x4. Patient is displaying verbally aggressive behaviors. Patient judgement is impaired. Patient eye contact is fair. Patient speech is direct and pressured at times. Patient speaks clear and calm one moment then verbally aggressive the next moment. Patient is now agitated with heightened anxiety level."  Demographic Factors:  Unemployed  Loss Factors: Decline in physical health  Historical Factors: Impulsivity  Risk Reduction Factors:   Positive social support and Positive therapeutic relationship  Continued Clinical Symptoms:  Severe Anxiety and/or Agitation More than one psychiatric diagnosis Previous Psychiatric Diagnoses and Treatments Medical Diagnoses and Treatments/Surgeries  Cognitive Features That Contribute To Risk:  None    Suicide Risk:  Minimal: No identifiable suicidal ideation.  Patients presenting with no risk factors but with morbid ruminations; may be classified as minimal risk based on the severity of the depressive symptoms  Assessment:  Denise Crane is a 53 y.o. female who was admitted from her living facility with SI and HI in the setting of agitation. Today, she is calm and appropriate in behavior. She reports feeling better. She denies thoughts to harm self, others or psychosis. She would  like therapy resources to develop healthy coping skills to manage stressors.   Plan Of Care/Follow-up recommendations:  -Continue psychotropic medications as prescribed.  -Provide patient with outpatient therapy resources.   -Discharge home.   Cherly Beach, DO 12/03/2018, 12:15 PM

## 2018-12-03 NOTE — ED Notes (Addendum)
After TTS Clinician completed assessment, RN informed clinician that patient is continually threatening HI and making demands for medication. Clinician went back to reassess for HI. Patient continued to endorse HI, when asked about a plan, patient stated, "yep, I am thinking about it". Patient continued to yell "get me the fuck out of here, send me to [facility], this [facility] hospital, get me the fuck out of here". Patient then slammed the remote control on the floor, stating "I am going to kill somebody over and over". Patient requesting to go to [facility] so she can see her psychiatrist. Patients behaviors continued to escalate while in patients room. Patient is alert and oriented x4. Patient is displaying verbally aggressive behaviors. Patient judgement is impaired. Patient eye contact is fair. Patient speech is direct and pressured at times. Patient speaks clear and calm one moment then verbally aggressive the next moment. Patient is now agitated with heightened anxiety level.   Donell Sievert, NP, patient meets inpatient criteria for geropsychiatry unit. TTS to seek placement. RN informed of disposition.

## 2018-12-03 NOTE — Discharge Instructions (Signed)
For your behavioral health needs, you are advised to follow up with an outpatient therapist.  Contact one of the following providers at your earliest opportunity to ask about scheduling an intake appointment:       [facility]      [address] [place] [address]      [phone]       [facility]      [address]      [place], [place] [address]      [phone]       [facility]      [phone] [address]      [place], [place] [address]      [phone]

## 2019-04-22 ENCOUNTER — Other Ambulatory Visit: Payer: Self-pay | Admitting: Physician Assistant

## 2019-04-22 DIAGNOSIS — Z1231 Encounter for screening mammogram for malignant neoplasm of breast: Secondary | ICD-10-CM

## 2019-06-16 ENCOUNTER — Ambulatory Visit
Admission: RE | Admit: 2019-06-16 | Discharge: 2019-06-16 | Disposition: A | Discharge status: Home / Self Care | Payer: Medicare HMO | Source: Ambulatory Visit | Attending: Physician Assistant | Admitting: Physician Assistant

## 2019-06-16 ENCOUNTER — Other Ambulatory Visit: Payer: Self-pay

## 2019-06-16 DIAGNOSIS — Z1231 Encounter for screening mammogram for malignant neoplasm of breast: Secondary | ICD-10-CM

## 2020-07-15 ENCOUNTER — Other Ambulatory Visit: Payer: Self-pay | Admitting: Physician Assistant

## 2020-07-15 DIAGNOSIS — Z1231 Encounter for screening mammogram for malignant neoplasm of breast: Secondary | ICD-10-CM

## 2020-08-16 ENCOUNTER — Ambulatory Visit
Admission: RE | Admit: 2020-08-16 | Discharge: 2020-08-16 | Disposition: A | Discharge status: Home / Self Care | Payer: Medicare HMO | Source: Ambulatory Visit | Attending: Physician Assistant | Admitting: Physician Assistant

## 2020-08-16 ENCOUNTER — Other Ambulatory Visit: Payer: Self-pay

## 2020-08-16 DIAGNOSIS — Z1231 Encounter for screening mammogram for malignant neoplasm of breast: Secondary | ICD-10-CM

## 2020-12-19 ENCOUNTER — Ambulatory Visit: Payer: Medicare Other | Admitting: Podiatry

## 2020-12-19 ENCOUNTER — Ambulatory Visit (INDEPENDENT_AMBULATORY_CARE_PROVIDER_SITE_OTHER): Payer: Medicare Other

## 2020-12-19 ENCOUNTER — Other Ambulatory Visit: Payer: Self-pay

## 2020-12-19 DIAGNOSIS — M2042 Other hammer toe(s) (acquired), left foot: Secondary | ICD-10-CM

## 2020-12-19 NOTE — Progress Notes (Signed)
   HPI: 55 y.o. female presenting today PMHx stroke 2011 on Eliquis 5 mg daily presenting for evaluation of hammertoes to the left foot digits 2 through 5.  Patient states they have been like that for the past few years.  It affects her ability to walk and her balance.  She states that with shoes they are very painful since she is not walking flat on her toes.  She presents for further treatment evaluation  Past Medical History:  Diagnosis Date  . Antiphospholipid syndrome (HCC)   . Anxiety   . CHF (congestive heart failure) (HCC)   . Depression   . Endometriosis   . Headache   . Hypertension   . PE (pulmonary embolism)    2011  . Pleural effusion, right    right pleural effusion s/p VATS/pleurodesis '13   . Stroke [facility])       Objective: Physical Exam General: The patient is alert and oriented x3 in no acute distress.  Dermatology: Skin is cool, dry and supple bilateral lower extremities. Negative for open lesions or macerations.  Vascular: Palpable pedal pulses bilaterally. No edema or erythema noted. Capillary refill within normal limits.  Neurological: Epicritic and protective threshold grossly intact bilaterally.   Musculoskeletal Exam: All pedal and ankle joints range of motion within normal limits bilateral. Muscle strength 5/5 in all groups bilateral. Hammertoe contracture deformity noted to digits 2-5 of the left foot.  Radiographic Exam: Hammertoe contracture deformity noted to the interphalangeal joints and MPJ of the respective hammertoe digits mentioned on clinical musculoskeletal exam.     Assessment: 1.  Hammertoes 2-5 left   Plan of Care:  1. Patient evaluated. X-Rays reviewed.  2. Today we discussed the conservative versus surgical management of the presenting pathology. The patient opts for surgical management. All possible complications and details of the procedure were explained. All patient questions were answered. No guarantees were expressed or  implied. 3. Authorization for surgery was initiated today. Surgery will consist of PIPJ arthrodesis 2-4 left.  PIPJ arthroplasty 5 left.  All toes with percutaneous pin fixation 4.  Medical clearance required from PCP prior to surgery 5.  Return to clinic 1 week postop  *Presenting today with her father      Felecia Shelling, DPM [facility]  Dr. Felecia Shelling, DPM    [address] [place], [place] [address]                Office [phone]  Fax [phone]

## 2020-12-23 ENCOUNTER — Encounter: Payer: Self-pay | Admitting: Podiatry

## 2020-12-27 ENCOUNTER — Telehealth: Payer: Self-pay | Admitting: Urology

## 2020-12-27 NOTE — Telephone Encounter (Signed)
DOS - 01/19/21  HAMMERTOE REPAIR 2-5 LEFT --- 45997  BCBS EFFECTIVE DATE - 09/24/20  PLAN DEDUCTIBLE - $0.00 W/ $0.00 REMAINING OUT OF POCKET - $5,900.00 W/ $5,702.52 REMAINING  COPAY - $0.00 COINSURANCE - 0%  PER BCBS WEBSIT NO PRIOR AUTH IS REQUIRED FOR CPT CODE 74142.

## 2020-12-30 ENCOUNTER — Telehealth: Payer: Self-pay

## 2020-12-30 NOTE — Telephone Encounter (Signed)
Received surgery clearance letter from Ralene Ok, PA-C with [facility]. The letter stated that Denise Crane was cleared for surgery. She needs to hold Eliquis one day before surgery and restart one day after surgery. Also needs to hold aspirin 7 days before surgery and can resume 24 hours after surgery. I spoke to [place] [employer], she stated she had already spoken with someone from [place] office and she stated she understood what she needed to do.

## 2021-01-19 ENCOUNTER — Other Ambulatory Visit: Payer: Self-pay | Admitting: Podiatry

## 2021-01-19 ENCOUNTER — Encounter: Payer: Self-pay | Admitting: Podiatry

## 2021-01-19 ENCOUNTER — Telehealth: Payer: Self-pay | Admitting: Podiatry

## 2021-01-19 DIAGNOSIS — M2042 Other hammer toe(s) (acquired), left foot: Secondary | ICD-10-CM | POA: Diagnosis not present

## 2021-01-19 MED ORDER — OXYCODONE-ACETAMINOPHEN 5-325 MG PO TABS: 1.0000 | ORAL_TABLET | ORAL | 0 refills | Status: AC | PRN

## 2021-01-19 NOTE — Telephone Encounter (Signed)
[facility] called our office stating that Dr.evans had prescribed Denise Crane a pain medication and they need the diagnosis for what she is being treated for.  Call back number : [phone]

## 2021-01-19 NOTE — Progress Notes (Signed)
PRN postop 

## 2021-01-25 ENCOUNTER — Ambulatory Visit (INDEPENDENT_AMBULATORY_CARE_PROVIDER_SITE_OTHER): Payer: Medicare Other

## 2021-01-25 ENCOUNTER — Other Ambulatory Visit: Payer: Self-pay

## 2021-01-25 ENCOUNTER — Ambulatory Visit (INDEPENDENT_AMBULATORY_CARE_PROVIDER_SITE_OTHER): Payer: Medicare Other | Admitting: Podiatry

## 2021-01-25 DIAGNOSIS — M2042 Other hammer toe(s) (acquired), left foot: Secondary | ICD-10-CM

## 2021-01-25 DIAGNOSIS — Z9889 Other specified postprocedural states: Secondary | ICD-10-CM

## 2021-02-01 ENCOUNTER — Ambulatory Visit (INDEPENDENT_AMBULATORY_CARE_PROVIDER_SITE_OTHER): Payer: Medicare Other | Admitting: Podiatry

## 2021-02-01 ENCOUNTER — Other Ambulatory Visit: Payer: Self-pay

## 2021-02-01 DIAGNOSIS — Z9889 Other specified postprocedural states: Secondary | ICD-10-CM

## 2021-02-01 DIAGNOSIS — M2042 Other hammer toe(s) (acquired), left foot: Secondary | ICD-10-CM

## 2021-02-01 NOTE — Progress Notes (Signed)
   Subjective:  Patient presents today status post hammertoe repair digits 2-5 left foot. DOS: 01/19/2021.  Patient states that she is doing well.  She has been weightbearing in the postsurgical shoe as instructed.  No new complaints at this time  Past Medical History:  Diagnosis Date  . Antiphospholipid syndrome (HCC)   . Anxiety   . CHF (congestive heart failure) (HCC)   . Depression   . Endometriosis   . Headache   . Hypertension   . PE (pulmonary embolism)    2011  . Pleural effusion, right    right pleural effusion s/p VATS/pleurodesis '13   . Stroke [facility])       Objective/Physical Exam Neurovascular status intact.  Skin incisions appear to be well coapted with sutures intact. No sign of infectious process noted. No dehiscence. No active bleeding noted. Moderate edema noted to the surgical extremity.  Assessment: 1. s/p hammertoe repair 2-5 left. DOS: 01/19/2021   Plan of Care:  1. Patient was evaluated.  2.  Sutures removed today 3.  Recommend clean cotton sock and continue minimal weightbearing in the postsurgical shoe 4.  Return to clinic in 2 weeks for percutaneous pin removal   Felecia Shelling, DPM [facility]  Dr. Felecia Shelling, DPM    [address] [place], [place] [address]                Office [phone]  Fax [phone]

## 2021-02-02 ENCOUNTER — Telehealth: Payer: Self-pay | Admitting: *Deleted

## 2021-02-02 NOTE — Telephone Encounter (Signed)
Diagnosis is postop foot surgery. - Dr. Logan Bores

## 2021-02-02 NOTE — Telephone Encounter (Signed)
Patient called with a question that she forgot to ask while in office. Can she resume exercising by riding her stationary bike.Please advise.

## 2021-02-02 NOTE — Telephone Encounter (Signed)
Called and gave information mother(Brenda)per Dr Logan Bores that patient is ok to ride bike only with post operative shoe,start out light,verbalized understanding his recommendations.

## 2021-02-02 NOTE — Telephone Encounter (Signed)
Okay to try exercise bike. No pressure on the forefoot. Heel pressure only. Must be wearing her postoperative shoe. Start light. - Dr. Logan Bores

## 2021-02-08 ENCOUNTER — Encounter: Payer: Medicare Other | Admitting: Podiatry

## 2021-02-09 ENCOUNTER — Telehealth: Payer: Self-pay | Admitting: *Deleted

## 2021-02-09 NOTE — Telephone Encounter (Signed)
Patient's mom Denise Crane) is calling with concerns because she was changing dressing 1 day ago and the surgical site had an odor, no drainage or streaking going up leg or foot noticed. Her toes appear swollen a little and pins seems to be coming out more. Please advise.

## 2021-02-10 NOTE — Telephone Encounter (Signed)
Returned call and spoke with mother of patient, has been scheduled for 02/15/21. Advised not to push the pin back if it comes out , verbalized understanding recommendations.

## 2021-02-10 NOTE — Telephone Encounter (Signed)
I would get her in for an appointment today with another provider or Monday with Dr Logan Bores

## 2021-02-10 NOTE — Telephone Encounter (Signed)
Advise to NOT push the pin back in if it comes out more

## 2021-02-13 NOTE — Progress Notes (Signed)
   Subjective:  Patient presents today status post hammertoe repair digits 2-5 left foot. DOS: 01/19/2021.  Patient states she is doing well.  She does not have much pain.  She denies fever chills nausea vomiting shortness of breath or chest pain.  She mention she is doing well and she is keeping the cam boot on.  Weightbearing as tolerated.  Past Medical History:  Diagnosis Date  . Antiphospholipid syndrome (HCC)   . Anxiety   . CHF (congestive heart failure) (HCC)   . Depression   . Endometriosis   . Headache   . Hypertension   . PE (pulmonary embolism)    2011  . Pleural effusion, right    right pleural effusion s/p VATS/pleurodesis '13   . Stroke [facility])       Objective/Physical Exam Neurovascular status intact.  Skin incisions appear to be well coapted with sutures  intact. No sign of infectious process noted. No dehiscence. No active bleeding noted. Moderate edema noted to the surgical extremity.  Radiographic Exam:  Orthopedic hardware and osteotomies sites appear to be stable with routine healing.  Assessment: 1. s/p hammertoe repair digits 2-5 left. DOS: 01/19/2021   Plan of Care:  1. Patient was evaluated. X-rays reviewed 2.  Dressings changed today.  Clean dry and intact x1 week 3.  Patient may discontinue the cam boot.  Postsurgical shoe dispensed.  Weightbearing as tolerated 4.  Return to clinic in 1 week for suture removal   Felecia Shelling, DPM [facility]  Dr. Felecia Shelling, DPM    [address] [place], [place] [address]                Office [phone]  Fax [phone]

## 2021-02-15 ENCOUNTER — Other Ambulatory Visit: Payer: Self-pay

## 2021-02-15 ENCOUNTER — Ambulatory Visit (INDEPENDENT_AMBULATORY_CARE_PROVIDER_SITE_OTHER): Payer: Medicare Other | Admitting: Podiatry

## 2021-02-15 DIAGNOSIS — Z9889 Other specified postprocedural states: Secondary | ICD-10-CM

## 2021-02-15 NOTE — Progress Notes (Signed)
   Subjective:  Patient presents today status post hammertoe repair digits 2-5 left foot. DOS: 01/19/2021.  Patient states that she is doing well.  She has been weightbearing in the postsurgical shoe as instructed.  No new complaints at this time  Past Medical History:  Diagnosis Date  . Antiphospholipid syndrome (HCC)   . Anxiety   . CHF (congestive heart failure) (HCC)   . Depression   . Endometriosis   . Headache   . Hypertension   . PE (pulmonary embolism)    2011  . Pleural effusion, right    right pleural effusion s/p VATS/pleurodesis '13   . Stroke [facility])       Objective/Physical Exam Neurovascular status intact.  Skin incisions appear to be well coapted and healed. No sign of infectious process noted. No dehiscence. No active bleeding noted. Moderate edema noted to the surgical extremity.  Assessment: 1. s/p hammertoe repair 2-5 left. DOS: 01/19/2021   Plan of Care:  1. Patient was evaluated.  2.  Percutaneous pins were removed today. 3.  Patient may begin to wash her foot and get it wet 4.  Patient may also discontinue the postsurgical shoe.  Transition into good supportive stability sneakers 5.  Return to clinic in 4 weeks for follow-up x-ray   Felecia Shelling, DPM [facility]  Dr. Felecia Shelling, DPM    [address] [place], [place] [address]                Office [phone]  Fax [phone]

## 2021-02-27 ENCOUNTER — Ambulatory Visit (INDEPENDENT_AMBULATORY_CARE_PROVIDER_SITE_OTHER): Payer: Medicare Other

## 2021-02-27 ENCOUNTER — Other Ambulatory Visit: Payer: Self-pay

## 2021-02-27 ENCOUNTER — Ambulatory Visit (INDEPENDENT_AMBULATORY_CARE_PROVIDER_SITE_OTHER): Payer: Medicare Other | Admitting: Podiatry

## 2021-02-27 DIAGNOSIS — Z9889 Other specified postprocedural states: Secondary | ICD-10-CM | POA: Diagnosis not present

## 2021-02-27 NOTE — Progress Notes (Signed)
   Subjective:  Patient presents today status post hammertoe repair digits 2-5 left foot. DOS: 01/19/2021.  Patient states that she is doing well.  She has been weightbearing and walking in good stability shoes.  No new complaints at this time  Past Medical History:  Diagnosis Date  . Antiphospholipid syndrome (HCC)   . Anxiety   . CHF (congestive heart failure) (HCC)   . Depression   . Endometriosis   . Headache   . Hypertension   . PE (pulmonary embolism)    2011  . Pleural effusion, right    right pleural effusion s/p VATS/pleurodesis '13   . Stroke [facility])       Objective/Physical Exam Neurovascular status intact.  Skin incisions appear to be well coapted and healed. No sign of infectious process noted. No dehiscence. No active bleeding noted.  Minimal edema noted to the surgical extremity.  Negative for any significant pain on palpation to the hammertoe deformities.  Assessment: 1. s/p hammertoe repair 2-5 left. DOS: 01/19/2021   Plan of Care:  1. Patient was evaluated.  2.  Patient is doing very well.  There is some mild edema noted however overall she is improving and wearing regular shoes 3.  Patient may slowly increase to full activity no restrictions 4.  Recommend good stability shoes at the shoe market on [address]   5.  Return to clinic as needed   Felecia Shelling, DPM [facility]  Dr. Felecia Shelling, DPM    [address] [place], [place] [address]                Office [phone]  Fax [phone]

## 2021-07-04 ENCOUNTER — Ambulatory Visit: Payer: Medicare Other | Attending: Student | Admitting: Physical Therapy

## 2021-07-04 ENCOUNTER — Encounter: Payer: Self-pay | Admitting: Physical Therapy

## 2021-07-04 ENCOUNTER — Other Ambulatory Visit: Payer: Self-pay

## 2021-07-04 DIAGNOSIS — R2689 Other abnormalities of gait and mobility: Secondary | ICD-10-CM | POA: Diagnosis present

## 2021-07-04 DIAGNOSIS — R29818 Other symptoms and signs involving the nervous system: Secondary | ICD-10-CM | POA: Diagnosis present

## 2021-07-04 DIAGNOSIS — R2681 Unsteadiness on feet: Secondary | ICD-10-CM | POA: Insufficient documentation

## 2021-07-04 DIAGNOSIS — I69354 Hemiplegia and hemiparesis following cerebral infarction affecting left non-dominant side: Secondary | ICD-10-CM | POA: Diagnosis not present

## 2021-07-05 NOTE — Therapy (Signed)
[facility] [address] [place], [place], [address] Phone: [phone]   Fax:  [phone]  Physical Therapy Evaluation  Patient Details  Name: Denise Crane MRN: [id number] Date of Birth: [birth year] Referring Provider (PT): Alfredo Martinez, [place]   Encounter Date: 07/04/2021   PT End of Session - 07/05/21 1843     Visit Number 1    Number of Visits 7    Date for PT Re-Evaluation 08/18/21    Authorization Type BCBS    PT Start Time 1150    PT Stop Time 1230    PT Time Calculation (min) 40 min    Activity Tolerance Patient tolerated treatment well    Behavior During Therapy [facility] for tasks assessed/performed             Past Medical History:  Diagnosis Date   Antiphospholipid syndrome (HCC)    Anxiety    CHF (congestive heart failure) (HCC)    Depression    Endometriosis    Headache    Hypertension    PE (pulmonary embolism)    2011   Pleural effusion, right    right pleural effusion s/p VATS/pleurodesis '13    Stroke [facility])     Past Surgical History:  Procedure Laterality Date   ABDOMINAL HYSTERECTOMY     ACHILLES TENDON LENGTHENING Left 06/21/2016   Procedure: LEFT ACHILLES LENGTHENING AND LEFT POSTIER TIBALIAS TRANSFER TO LATERAL CUNEIFORM;  Surgeon: Toni Arthurs, MD;  Location: [facility] OR;  Service: Orthopedics;  Laterality: Left;   APPENDECTOMY     PLEURAL BIOPSY      There were no vitals filed for this visit.    Subjective Assessment - 07/04/21 1202     Subjective Pt had surgery on Lt ankle in Sept. 2017 - Lt Achilles lengthening and Lt poterior tibialis transfer to cuneiform so that she would not need to wear her AFO.  Pt  had PT at this facility in Dec. 2017 (6 visits)    Pertinent History h/o CVA with spastic hemiparesis 2011, Lt hammertoe repair digits 2-5 on 01-19-21, Lt Achilles lengthening & Lt posterior tibialis transfer Sept. 2017, HTN, PE 2011    Limitations Standing;Walking    Patient  Stated Goals "Improve balance and get my left foot flatter"  - wants a flatter arch in Lt foot    Currently in Pain? No/denies                [facility] PT Assessment - 07/05/21 0001       Assessment   Medical Diagnosis Gait abnormality; s/p Lt Achilles lengthening in Sept. 2017    Referring Provider (PT) Alfredo Martinez, PA-C    Onset Date/Surgical Date --   Sept. 2017   Prior Therapy Dec. 2017 at this facility for 6 visits      Precautions   Precautions Fall      Balance Screen   Has the patient fallen in the past 6 months No    Has the patient had a decrease in activity level because of a fear of falling?  No    Is the patient reluctant to leave their home because of a fear of falling?  No      Home Environment   Living Environment Private residence    Type of Home Apartment    Home Access Level entry    Home Layout One level      Prior Function   Level of Independence Independent with household mobility with device;Independent with  community mobility with device;Independent with basic ADLs      ROM / Strength   AROM / PROM / Strength Strength;AROM      AROM   Overall AROM Comments pt has only approx. 10 degrees of Lt active plantarflexion      Strength   Overall Strength Deficits    Strength Assessment Site Hip;Knee;Ankle    Right/Left Hip Left    Left Hip Flexion 4/5    Right/Left Knee Left    Left Knee Flexion 3-/5    Left Knee Extension 4/5    Right/Left Ankle Left    Left Ankle Dorsiflexion 3-/5    Left Ankle Plantar Flexion 1/5      Ambulation/Gait   Ambulation/Gait Yes    Ambulation/Gait Assistance 6: Modified independent (Device/Increase time)    Ambulation Distance (Feet) 50 Feet    Assistive device Small based quad cane    Gait Pattern Decreased step length - left;Decreased hip/knee flexion - left;Decreased weight shift to left   decr. push off due to lack of plantarflexion LLE   Ambulation Surface Level;Indoor    Gait velocity 23.88 secs = 1.37 secs  with cane      Standardized Balance Assessment   Standardized Balance Assessment Timed Up and Go Test      Timed Up and Go Test   TUG Normal TUG    Normal TUG (seconds) 20.56   with cane                       Objective measurements completed on examination: See above findings.                PT Education - 07/05/21 1841     Education Details pt was instructed to roll water bottle with Lt foot to mobilize fascia; pt was informed that arch may be unable to be changed since sx was 5 yrs ago    Person(s) Educated Patient    Methods Explanation    Comprehension Verbalized understanding              PT Short Term Goals - 07/05/21 1917       PT SHORT TERM GOAL #1   Title Independent with HEP - STG's due    Time 3    Period Weeks    Status New    Target Date 07/28/21      PT SHORT TERM GOAL #2   Title Improve TUG score to </= 17.5 secs with cane for reduced fall risk.    Baseline 20.56 secs with cane    Time 3    Period Weeks    Status New    Target Date 07/28/21      PT SHORT TERM GOAL #3   Title Pt will amb. 115' with cane without LOB on flat, even surface.    Time 3    Period Weeks    Status New    Target Date 07/28/21               PT Long Term Goals - 07/05/21 1924       PT LONG TERM GOAL #1   Title Pt will amb. 68' without SPC without LOB with SBA on flat, even surface.    Time 6    Period Weeks    Status New    Target Date 08/18/21      PT LONG TERM GOAL #2   Title Improve TUG score to </ = 15.5 secs  with SPC for reduced fall risk.    Baseline 20.56 secs with SPC    Time 6    Period Weeks    Status New    Target Date 08/18/21      PT LONG TERM GOAL #3   Title Improve gait velocity to >/= 1.8 ft/sec with SPC for increased gait efficiency.    Baseline 1.3 ft/sec = 23.88 secs    Time 6    Period Weeks    Status New    Target Date 08/18/21      PT LONG TERM GOAL #4   Title Pt will report at least 25%  improvement in Lt foot flat/ contact with floor in standing, i.e. less supination due to incr. tone.    Time 6    Period Weeks    Status New    Target Date 08/18/21      PT LONG TERM GOAL #5   Title Independent in updated HEP as appropriate.    Time 6    Period Weeks    Status New    Target Date 08/18/21                    Plan - 07/05/21 1845     Clinical Impression Statement Pt is a 55 yr old lady s/p Lt Achilles tendon lengthening in Sept. 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  Pt presents to PT with goals to work on balance and try "to get the arch flatter in left foot".  Pt reports the arch is too high and this impacts her standing balance.  Lt foot appears to supinate in standing (worse without shoe donned) due to spasticity/increased tone.  Pt able to dorsiflex Lt foot but has very minimal active & passive plantarflexion LLE.  Pt is at fall risk per TUG score of 20.56 secs with cane.  Pt will benefit from PT to address gait and balance deficits and for stretching to reduce tone in LLE.    Personal Factors and Comorbidities Comorbidity 2;Time since onset of injury/illness/exacerbation;Past/Current Experience;Fitness    Comorbidities h/o CVA in 2011 with Lt spastic hemiparesis, Lt hammertoe repair digits 2-5 Lt foot (01-19-21), CHF, depression, HTN, PE 2011; Lt Achilles lengthening Sept. 2017    Examination-Activity Limitations Locomotion Level;Transfers;Squat;Stairs;Stand    Examination-Participation Restrictions Cleaning;Community Activity;Laundry;Shop;Meal Prep    Stability/Clinical Decision Making Evolving/Moderate complexity    Clinical Decision Making Moderate    Rehab Potential Good    PT Frequency 1x / week    PT Duration 6 weeks    PT Treatment/Interventions ADLs/Self Care Home Management;Aquatic Therapy;Gait training;Stair training;Therapeutic activities;Therapeutic exercise;Balance training;Patient/family education;Neuromuscular re-education;Manual  techniques;Passive range of motion    PT Next Visit Plan issue balance HEP, stretching LLE    Consulted and Agree with Plan of Care Patient             Patient will benefit from skilled therapeutic intervention in order to improve the following deficits and impairments:  Abnormal gait, Decreased balance, Decreased range of motion, Decreased strength, Impaired flexibility, Impaired tone  Visit Diagnosis: Hemiplegia and hemiparesis following cerebral infarction affecting left non-dominant side (HCC) - Plan: PT plan of care cert/re-cert  Other abnormalities of gait and mobility - Plan: PT plan of care cert/re-cert  Unsteadiness on feet - Plan: PT plan of care cert/re-cert  Other symptoms and signs involving the nervous system - Plan: PT plan of care cert/re-cert     Problem List Patient Active Problem List  Diagnosis Date Noted   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 [place], [place], [address] Phone: [phone]   Fax:  [phone]  Name: Antonela Freiman MRN: [id number] Date of Birth: August 20, 1966

## 2021-07-25 ENCOUNTER — Encounter: Payer: Self-pay | Admitting: Physical Therapy

## 2021-07-25 ENCOUNTER — Other Ambulatory Visit: Payer: Self-pay

## 2021-07-25 ENCOUNTER — Ambulatory Visit: Payer: Medicare Other | Attending: Student | Admitting: Physical Therapy

## 2021-07-25 DIAGNOSIS — R29818 Other symptoms and signs involving the nervous system: Secondary | ICD-10-CM | POA: Diagnosis present

## 2021-07-25 DIAGNOSIS — R2681 Unsteadiness on feet: Secondary | ICD-10-CM | POA: Diagnosis present

## 2021-07-25 DIAGNOSIS — R2689 Other abnormalities of gait and mobility: Secondary | ICD-10-CM | POA: Diagnosis present

## 2021-07-25 DIAGNOSIS — I69354 Hemiplegia and hemiparesis following cerebral infarction affecting left non-dominant side: Secondary | ICD-10-CM | POA: Insufficient documentation

## 2021-07-25 NOTE — Therapy (Signed)
[facility] [address] [place], [place], [address] Phone: [phone]   Fax:  [phone]  Physical Therapy Treatment  Patient Details  Name: Denise Crane MRN: [id number] Date of Birth: 1966/05/29 Referring Provider (PT): Alfredo Martinez, [place]   Encounter Date: 07/25/2021   PT End of Session - 07/25/21 1925     Visit Number 2    Number of Visits 7    Date for PT Re-Evaluation 08/18/21    Authorization Type BCBS    PT Start Time 1150    PT Stop Time 1236    PT Time Calculation (min) 46 min    Activity Tolerance Patient tolerated treatment well    Behavior During Therapy [facility] for tasks assessed/performed             Past Medical History:  Diagnosis Date   Antiphospholipid syndrome (HCC)    Anxiety    CHF (congestive heart failure) (HCC)    Depression    Endometriosis    Headache    Hypertension    PE (pulmonary embolism)    2011   Pleural effusion, right    right pleural effusion s/p VATS/pleurodesis '13    Stroke [facility])     Past Surgical History:  Procedure Laterality Date   ABDOMINAL HYSTERECTOMY     ACHILLES TENDON LENGTHENING Left 06/21/2016   Procedure: LEFT ACHILLES LENGTHENING AND LEFT POSTIER TIBALIAS TRANSFER TO LATERAL CUNEIFORM;  Surgeon: Toni Arthurs, MD;  Location: [facility] OR;  Service: Orthopedics;  Laterality: Left;   APPENDECTOMY     PLEURAL BIOPSY      There were no vitals filed for this visit.   Subjective Assessment - 07/25/21 1153     Subjective Pt says she was able to use a weight with prickly projections on it instead of the water bottle for rolling her foot on it - states the projections may help to increase sensation    Pertinent History h/o CVA with spastic hemiparesis 2011, Lt hammertoe repair digits 2-5 on 01-19-21, Lt Achilles lengthening & Lt posterior tibialis transfer Sept. 2017, HTN, PE 2011    Limitations Standing;Walking    Patient Stated Goals "Improve balance and get my  left foot flatter"  - wants a flatter arch in Lt foot    Currently in Pain? No/denies                               [facility] Adult PT Treatment/Exercise - 07/25/21 1159       Exercises   Exercises Knee/Hip      Knee/Hip Exercises: Stretches   Active Hamstring Stretch Left;1 rep;60 seconds   hamstring/gastroc stretch     Knee/Hip Exercises: Seated   Long Arc Quad AROM;Left;1 set;10 reps      Knee/Hip Exercises: Supine   Heel Slides AROM;Left;1 set;10 reps    Bridges AROM;Both;1 set;10 reps    Single Leg Bridge AROM;Left;1 set;10 reps    Straight Leg Raises Strengthening;Left;1 set;10 reps   2#   Other Supine Knee/Hip Exercises attempted Lt hip extension control exercise off side of mat - pt reported much discomfort in Lt groin and Lt lateral thigh with leg in extension off side of table      Knee/Hip Exercises: Sidelying   Other Sidelying Knee/Hip Exercises passive Lt hip flexor stretch in Rt sidelying position; Lt ITB tenderness noted with palpation - used foam roller and rolled area with moderate pressure  Medbridge [id number]         PT Education - 07/25/21 1924     Education Details Medbridge [id number]    Person(s) Educated Patient    Methods Explanation;Demonstration;Handout    Comprehension Verbalized understanding;Returned demonstration              PT Short Term Goals - 07/25/21 1926       PT SHORT TERM GOAL #1   Title Independent with HEP - STG's due    Time 3    Period Weeks    Status New    Target Date 07/28/21      PT SHORT TERM GOAL #2   Title Improve TUG score to </= 17.5 secs with cane for reduced fall risk.    Baseline 20.56 secs with cane    Time 3    Period Weeks    Status New    Target Date 07/28/21      PT SHORT TERM GOAL #3   Title Pt will amb. 115' with cane without LOB on flat, even surface.    Time 3    Period Weeks    Status New    Target Date 07/28/21               PT Long Term Goals  - 07/25/21 1926       PT LONG TERM GOAL #1   Title Pt will amb. 65' without SPC without LOB with SBA on flat, even surface.    Time 6    Period Weeks    Status New      PT LONG TERM GOAL #2   Title Improve TUG score to </ = 15.5 secs with SPC for reduced fall risk.    Baseline 20.56 secs with SPC    Time 6    Period Weeks    Status New      PT LONG TERM GOAL #3   Title Improve gait velocity to >/= 1.8 ft/sec with SPC for increased gait efficiency.    Baseline 1.3 ft/sec = 23.88 secs    Time 6    Period Weeks    Status New      PT LONG TERM GOAL #4   Title Pt will report at least 25% improvement in Lt foot flat/ contact with floor in standing, i.e. less supination due to incr. tone.    Time 6    Period Weeks    Status New      PT LONG TERM GOAL #5   Title Independent in updated HEP as appropriate.    Time 6    Period Weeks    Status New                   Plan - 07/25/21 1928     Clinical Impression Statement Skilled PT session focused on stretching and strengthening exercises for LLE for tone reduction for more neutral positioning of Lt foot in standing for less supination.  Pt was instructed in HEP for stretching of Lt hamstrings and gastrocs and for strengthening Lt hip and knee musc. in anti gravity position.  Pt has tight Lt ITB with some c/o discomfort reported with palpation and with rolling area with use of foam roller. Cont with POC.    Personal Factors and Comorbidities Comorbidity 2;Time since onset of injury/illness/exacerbation;Past/Current Experience;Fitness    Comorbidities h/o CVA in 2011 with Lt spastic hemiparesis, Lt hammertoe repair digits 2-5 Lt foot (01-19-21), CHF, depression, HTN, PE 2011; Lt Achilles lengthening  Sept. 2017    Examination-Activity Limitations Locomotion Level;Transfers;Squat;Stairs;Stand    Examination-Participation Restrictions Cleaning;Community Activity;Laundry;Shop;Meal Prep    Stability/Clinical Decision Making  Evolving/Moderate complexity    Rehab Potential Good    PT Frequency 1x / week    PT Duration 6 weeks    PT Treatment/Interventions ADLs/Self Care Home Management;Aquatic Therapy;Gait training;Stair training;Therapeutic activities;Therapeutic exercise;Balance training;Patient/family education;Neuromuscular re-education;Manual techniques;Passive range of motion    PT Next Visit Plan check HEP - Medbridge    PT Home Exercise Plan Medbridge    Consulted and Agree with Plan of Care Patient             Patient will benefit from skilled therapeutic intervention in order to improve the following deficits and impairments:  Abnormal gait, Decreased balance, Decreased range of motion, Decreased strength, Impaired flexibility, Impaired tone  Visit Diagnosis: Hemiplegia and hemiparesis following cerebral infarction affecting left non-dominant side (HCC)  Other abnormalities of gait and mobility  Other symptoms and signs involving the nervous system     Problem List Patient Active Problem List   Diagnosis Date Noted   Adjustment disorder with disturbance of emotion 07/01/2016    Kary Kos, PT 07/25/2021, 7:51 PM  [facility] [address] [place], [place], [address] Phone: [phone]   Fax:  [phone]  Name: Denise Crane MRN: [id number] Date of Birth: [birth year]

## 2021-08-01 ENCOUNTER — Other Ambulatory Visit: Payer: Self-pay

## 2021-08-01 ENCOUNTER — Ambulatory Visit: Payer: Medicare Other | Admitting: Physical Therapy

## 2021-08-01 DIAGNOSIS — I69354 Hemiplegia and hemiparesis following cerebral infarction affecting left non-dominant side: Secondary | ICD-10-CM | POA: Diagnosis not present

## 2021-08-01 DIAGNOSIS — R2689 Other abnormalities of gait and mobility: Secondary | ICD-10-CM

## 2021-08-01 DIAGNOSIS — R2681 Unsteadiness on feet: Secondary | ICD-10-CM

## 2021-08-02 NOTE — Therapy (Signed)
[facility] [address] [place], [place], [address] Phone: [phone]   Fax:  [phone]  Physical Therapy Treatment  Patient Details  Name: Denise Crane MRN: [id number] Date of Birth: 10-05-1965 Referring Provider (PT): Alfredo Martinez, [place]   Encounter Date: 08/01/2021   PT End of Session - 08/02/21 1228     Visit Number 3    Number of Visits 7    Date for PT Re-Evaluation 08/18/21    Authorization Type BCBS    PT Start Time 1147    PT Stop Time 1235    PT Time Calculation (min) 48 min    Activity Tolerance Patient tolerated treatment well    Behavior During Therapy [facility] for tasks assessed/performed             Past Medical History:  Diagnosis Date   Antiphospholipid syndrome (HCC)    Anxiety    CHF (congestive heart failure) (HCC)    Depression    Endometriosis    Headache    Hypertension    PE (pulmonary embolism)    2011   Pleural effusion, right    right pleural effusion s/p VATS/pleurodesis '13    Stroke [facility])     Past Surgical History:  Procedure Laterality Date   ABDOMINAL HYSTERECTOMY     ACHILLES TENDON LENGTHENING Left 06/21/2016   Procedure: LEFT ACHILLES LENGTHENING AND LEFT POSTIER TIBALIAS TRANSFER TO LATERAL CUNEIFORM;  Surgeon: Toni Arthurs, MD;  Location: [facility] OR;  Service: Orthopedics;  Laterality: Left;   APPENDECTOMY     PLEURAL BIOPSY      There were no vitals filed for this visit.   Subjective Assessment - 08/02/21 1209     Subjective Pt reports she feels her Lt foot is getting looser - feels that it is a little flatter when she stands up and not quite as supinated, resulting in improved balance upon standing    Pertinent History h/o CVA with spastic hemiparesis 2011, Lt hammertoe repair digits 2-5 on 01-19-21, Lt Achilles lengthening & Lt posterior tibialis transfer Sept. 2017, HTN, PE 2011    Limitations Standing;Walking    Patient Stated Goals "Improve balance and get my  left foot flatter"  - wants a flatter arch in Lt foot    Currently in Pain? No/denies                               [facility] Adult PT Treatment/Exercise - 08/02/21 0001       Transfers   Transfers Sit to Stand;Stand to Sit    Sit to Stand 4: Min assist;4: Min guard   tactile cues to increase weight shift onto LLE during sit to stand   Sit to Stand Details (indicate cue type and reason) Rt foot placed on 2" block for 2nd set of 5 reps    Stand to Sit 4: Min guard    Number of Reps 10 reps;1 set      Ambulation/Gait   Ambulation/Gait Yes    Ambulation/Gait Assistance 5: Supervision    Ambulation Distance (Feet) 115 Feet    Assistive device Small based quad cane    Gait Pattern Decreased step length - left;Decreased hip/knee flexion - left;Decreased weight shift to left   decr. push off due to lack of plantarflexion LLE   Ambulation Surface Level;Indoor    Stairs Yes    Stairs Assistance 4: Min guard    Stair Management Technique  One rail Right;Step to pattern;Forwards    Number of Stairs 4   2 reps = 8 steps     Exercises   Exercises Knee/Hip;Ankle      Knee/Hip Exercises: Stretches   Passive Hamstring Stretch Left;1 rep;30 seconds      Knee/Hip Exercises: Standing   Forward Step Up Left;1 set;10 reps;Hand Hold: 1;Step Height: 6"      Knee/Hip Exercises: Supine   Heel Slides AROM;Left;1 set;10 reps    Bridges AROM;Both;1 set;10 reps    Straight Leg Raises Strengthening;Left;1 set;10 reps   3#   Other Supine Knee/Hip Exercises bridging with RLE extension 5 reps:  bridging with marching 5 reps;  bridging with hip abdct/adduction 5 reps      Knee/Hip Exercises: Sidelying   Hip ABduction AAROM;Left;1 set;10 reps    Clams LLE - 10 reps- no weight used                       PT Short Term Goals - 08/02/21 1232       PT SHORT TERM GOAL #1   Title Independent with HEP - STG's due    Time 3    Period Weeks    Status New    Target Date 07/28/21       PT SHORT TERM GOAL #2   Title Improve TUG score to </= 17.5 secs with cane for reduced fall risk.    Baseline 20.56 secs with cane    Time 3    Period Weeks    Status New    Target Date 07/28/21      PT SHORT TERM GOAL #3   Title Pt will amb. 115' with cane without LOB on flat, even surface.    Time 3    Period Weeks    Status New    Target Date 07/28/21               PT Long Term Goals - 08/02/21 1232       PT LONG TERM GOAL #1   Title Pt will amb. 18' without SPC without LOB with SBA on flat, even surface.    Time 6    Period Weeks    Status New      PT LONG TERM GOAL #2   Title Improve TUG score to </ = 15.5 secs with SPC for reduced fall risk.    Baseline 20.56 secs with SPC    Time 6    Period Weeks    Status New      PT LONG TERM GOAL #3   Title Improve gait velocity to >/= 1.8 ft/sec with SPC for increased gait efficiency.    Baseline 1.3 ft/sec = 23.88 secs    Time 6    Period Weeks    Status New      PT LONG TERM GOAL #4   Title Pt will report at least 25% improvement in Lt foot flat/ contact with floor in standing, i.e. less supination due to incr. tone.    Time 6    Period Weeks    Status New      PT LONG TERM GOAL #5   Title Independent in updated HEP as appropriate.    Time 6    Period Weeks    Status New                   Plan - 08/02/21 1229     Clinical Impression Statement Pt  improving with LLE AROM and strengthening exercises;  weight increased from 2# to 3# for PRE's for LLE.  Pt continues to have difficulty weight shifting onto LLE during sit to stand transfers and needs tactile cues to increase LLE weight bearing as she weight bears approx. 75% on RLE during transfer.  Cont to have Lt hip flexor tightness and is unable to tolerate Lt hip flexor stretch with LLE off side of mat table.  Cont with POC.    Personal Factors and Comorbidities Comorbidity 2;Time since onset of injury/illness/exacerbation;Past/Current  Experience;Fitness    Comorbidities h/o CVA in 2011 with Lt spastic hemiparesis, Lt hammertoe repair digits 2-5 Lt foot (01-19-21), CHF, depression, HTN, PE 2011; Lt Achilles lengthening Sept. 2017    Examination-Activity Limitations Locomotion Level;Transfers;Squat;Stairs;Stand    Examination-Participation Restrictions Cleaning;Community Activity;Laundry;Shop;Meal Prep    Stability/Clinical Decision Making Evolving/Moderate complexity    Rehab Potential Good    PT Frequency 1x / week    PT Duration 6 weeks    PT Treatment/Interventions ADLs/Self Care Home Management;Aquatic Therapy;Gait training;Stair training;Therapeutic activities;Therapeutic exercise;Balance training;Patient/family education;Neuromuscular re-education;Manual techniques;Passive range of motion    PT Next Visit Plan check STG's - cont LLE strengthening exercises    PT Home Exercise Plan Medbridge    Consulted and Agree with Plan of Care Patient             Patient will benefit from skilled therapeutic intervention in order to improve the following deficits and impairments:  Abnormal gait, Decreased balance, Decreased range of motion, Decreased strength, Impaired flexibility, Impaired tone  Visit Diagnosis: Hemiplegia and hemiparesis following cerebral infarction affecting left non-dominant side (HCC)  Other abnormalities of gait and mobility  Unsteadiness on feet     Problem List Patient Active Problem List   Diagnosis Date Noted   Adjustment disorder with disturbance of emotion 07/01/2016    Kary Kos, PT 08/02/2021, 12:35 PM  [facility] [address] [place], [place], [address] Phone: [phone]   Fax:  [phone]  Name: Zoa Dowty MRN: [id number] Date of Birth: 04-09-1966

## 2021-08-08 ENCOUNTER — Ambulatory Visit: Payer: Medicare Other | Admitting: Physical Therapy

## 2021-08-08 ENCOUNTER — Other Ambulatory Visit: Payer: Self-pay

## 2021-08-08 DIAGNOSIS — R2681 Unsteadiness on feet: Secondary | ICD-10-CM

## 2021-08-08 DIAGNOSIS — I69354 Hemiplegia and hemiparesis following cerebral infarction affecting left non-dominant side: Secondary | ICD-10-CM

## 2021-08-08 DIAGNOSIS — R2689 Other abnormalities of gait and mobility: Secondary | ICD-10-CM

## 2021-08-09 ENCOUNTER — Encounter: Payer: Self-pay | Admitting: Physical Therapy

## 2021-08-09 NOTE — Therapy (Signed)
[place] [address] [place], [place], [address] Phone: [phone]   Fax:  [phone]  Physical Therapy Treatment  Patient Details  Name: Denise Crane MRN: [id number] Date of Birth: [birth year] Referring Provider (PT): Mechele Claude, [place]   Encounter Date: 08/08/2021   PT End of Session - 08/09/21 1545     Visit Number 4    Number of Visits 7    Date for PT Re-Evaluation 08/18/21    Authorization Type BCBS    PT Start Time 1146    PT Stop Time 6270    PT Time Calculation (min) 49 min    Activity Tolerance Patient tolerated treatment well    Behavior During Therapy [facility] for tasks assessed/performed             Past Medical History:  Diagnosis Date   Antiphospholipid syndrome ([place])    Anxiety    CHF (congestive heart failure) ([place])    Depression    Endometriosis    Headache    Hypertension    PE (pulmonary embolism)    2011   Pleural effusion, right    right pleural effusion s/p VATS/pleurodesis '13    Stroke [facility])     Past Surgical History:  Procedure Laterality Date   ABDOMINAL HYSTERECTOMY     ACHILLES TENDON LENGTHENING Left 06/21/2016   Procedure: LEFT ACHILLES LENGTHENING AND LEFT POSTIER TIBALIAS TRANSFER TO LATERAL CUNEIFORM;  Surgeon: Wylene Simmer, MD;  Location: [place];  Service: Orthopedics;  Laterality: Left;   APPENDECTOMY     PLEURAL BIOPSY      There were no vitals filed for this visit.   Subjective Assessment - 08/08/21 1154     Subjective Pt reports no problems or changes - Lt foot still wont go down completely in standing    Pertinent History h/o CVA with spastic hemiparesis 2011, Lt hammertoe repair digits 2-5 on 01-19-21, Lt Achilles lengthening & Lt posterior tibialis transfer Sept. 2017, HTN, PE 2011    Limitations Standing;Walking    Patient Stated Goals "Improve balance and get my left foot flatter"  - wants a flatter arch in Lt foot    Currently in Pain? Yes    Pain  Location Knee    Pain Orientation Right    Pain Descriptors / Indicators Discomfort;Dull;Aching;Sore    Pain Type Acute pain    Pain Onset In the past 7 days    Pain Frequency Intermittent    Pain Relieving Factors rubbing makes it feel better                               [facility] Adult PT Treatment/Exercise - 08/09/21 0001       Transfers   Transfers Sit to Stand;Stand to Sit    Sit to Stand 4: Min assist    Sit to Stand Details (indicate cue type and reason) Rt foot placed on 2" block for increased weight bearing on LLE during sit to stand transfer    Stand to Sit 4: Min guard    Number of Reps 10 reps;1 set    Comments attempted Lt foot on balance bubble but pt stated this was too difficult and had difficulty maintaining balance, had fear of fall so this was switched to use of 2" block      Ambulation/Gait   Ambulation/Gait Yes    Ambulation/Gait Assistance 5: Supervision    Ambulation Distance (Feet) 100 Feet  Assistive device Small based quad cane    Gait Pattern Decreased step length - left;Decreased hip/knee flexion - left;Decreased weight shift to left   decr. push off due to lack of plantarflexion LLE   Ambulation Surface Level;Indoor    Stairs Yes    Stairs Assistance 4: Min guard    Stair Management Technique One rail Right    Number of Stairs 4    Height of Stairs 6      Knee/Hip Exercises: Stretches   Passive Hamstring Stretch Left;1 rep;30 seconds      Knee/Hip Exercises: Standing   Forward Step Up Left;1 set;10 reps;Hand Hold: 1;Step Height: 6"      Knee/Hip Exercises: Seated   Long Arc Quad AROM;Left;1 set;10 reps      Knee/Hip Exercises: Supine   Heel Slides AROM;Left;1 set;10 reps    Bridges AROM;Both;1 set;10 reps    Single Leg Bridge AROM;Left;1 set;10 reps    Straight Leg Raises Strengthening;Left;1 set;10 reps   2#     Knee/Hip Exercises: Sidelying   Hip ABduction AAROM;Left;1 set;10 reps    Clams LLE - 10 reps- no weight  used    Other Sidelying Knee/Hip Exercises passive Lt hip flexor stretch in Rt sidelying position; Lt ITB tenderness noted with palpation - used foam roller and rolled area with moderate pressure             NeuroRe-ed:  tap ups with RLE to 1st step to facilitate incr. Weight shift onto LLE and for improved Lt SLS; pt used hand rail (Minimal) for assist with balance - with CGA for safety          PT Short Term Goals - 08/08/21 1156       PT SHORT TERM GOAL #1   Title Independent with HEP - STG's due    Baseline met 08-08-21    Time 3    Period Weeks    Status Achieved    Target Date 07/28/21      PT SHORT TERM GOAL #2   Title Improve TUG score to </= 17.5 secs with cane for reduced fall risk.    Baseline 20.56 secs with cane;  TUG score 22.91 secs on 08-08-21    Time 3    Period Weeks    Status New    Target Date 07/28/21      PT SHORT TERM GOAL #3   Title Pt will amb. 115' with cane without LOB on flat, even surface.    Baseline met 08-08-21    Time 3    Period Weeks    Status Achieved    Target Date 07/28/21               PT Long Term Goals - 08/08/21 1153       PT LONG TERM GOAL #1   Title Pt will amb. 104' without SPC without LOB with SBA on flat, even surface.    Time 6    Period Weeks    Status New      PT LONG TERM GOAL #2   Title Improve TUG score to </ = 15.5 secs with SPC for reduced fall risk.    Baseline 20.56 secs with SPC    Time 6    Period Weeks    Status New      PT LONG TERM GOAL #3   Title Improve gait velocity to >/= 1.8 ft/sec with SPC for increased gait efficiency.    Baseline 1.3 ft/sec =  23.88 secs    Time 6    Period Weeks    Status New      PT LONG TERM GOAL #4   Title Pt will report at least 25% improvement in Lt foot flat/ contact with floor in standing, i.e. less supination due to incr. tone.    Time 6    Period Weeks    Status New      PT LONG TERM GOAL #5   Title Independent in updated HEP as appropriate.     Time 6    Period Weeks    Status New                   Plan - 08/09/21 1547     Clinical Impression Statement Pt has met STG's #1 and 3:  STG #2 not met as TUG score = 22.91 secs - 2 sec slower compared to initial score of 20.56 secs at eval.  Cont with POC.    Personal Factors and Comorbidities Comorbidity 2;Time since onset of injury/illness/exacerbation;Past/Current Experience;Fitness    Comorbidities h/o CVA in 2011 with Lt spastic hemiparesis, Lt hammertoe repair digits 2-5 Lt foot (01-19-21), CHF, depression, HTN, PE 2011; Lt Achilles lengthening Sept. 2017    Examination-Activity Limitations Locomotion Level;Transfers;Squat;Stairs;Stand    Examination-Participation Restrictions Cleaning;Community Activity;Laundry;Shop;Meal Prep    Stability/Clinical Decision Making Evolving/Moderate complexity    Rehab Potential Good    PT Frequency 1x / week    PT Duration 6 weeks    PT Treatment/Interventions ADLs/Self Care Home Management;Aquatic Therapy;Gait training;Stair training;Therapeutic activities;Therapeutic exercise;Balance training;Patient/family education;Neuromuscular re-education;Manual techniques;Passive range of motion    PT Next Visit Plan check STG's - cont LLE strengthening exercises    PT Home Exercise Plan Medbridge    Consulted and Agree with Plan of Care Patient             Patient will benefit from skilled therapeutic intervention in order to improve the following deficits and impairments:  Abnormal gait, Decreased balance, Decreased range of motion, Decreased strength, Impaired flexibility, Impaired tone  Visit Diagnosis: Hemiplegia and hemiparesis following cerebral infarction affecting left non-dominant side (HCC)  Other abnormalities of gait and mobility  Unsteadiness on feet     Problem List Patient Active Problem List   Diagnosis Date Noted   Adjustment disorder with disturbance of emotion 07/01/2016    Alda Lea,  PT 08/09/2021, 3:50 PM  [place] [address] [place], [place], [address] Phone: [phone]   Fax:  [phone]  Name: Jalyssa Fleisher MRN: [id number] Date of Birth: [birth year]

## 2021-08-15 ENCOUNTER — Ambulatory Visit: Payer: Medicare Other | Admitting: Physical Therapy

## 2021-08-16 ENCOUNTER — Other Ambulatory Visit: Payer: Self-pay

## 2021-08-16 ENCOUNTER — Encounter: Payer: Self-pay | Admitting: Physical Therapy

## 2021-08-16 ENCOUNTER — Ambulatory Visit: Payer: Medicare Other | Admitting: Physical Therapy

## 2021-08-16 DIAGNOSIS — R2689 Other abnormalities of gait and mobility: Secondary | ICD-10-CM

## 2021-08-16 DIAGNOSIS — R2681 Unsteadiness on feet: Secondary | ICD-10-CM

## 2021-08-16 DIAGNOSIS — I69354 Hemiplegia and hemiparesis following cerebral infarction affecting left non-dominant side: Secondary | ICD-10-CM | POA: Diagnosis not present

## 2021-08-17 ENCOUNTER — Encounter: Payer: Self-pay | Admitting: Physical Therapy

## 2021-08-17 NOTE — Therapy (Signed)
[facility] [address] [place], [place], [address] Phone: [phone]   Fax:  [phone]  Physical Therapy Treatment  Patient Details  Name: Denise Crane MRN: [id number] Date of Birth: 1966-07-18 Referring Provider (PT): Mechele Claude, [place]   Encounter Date: 08/16/2021   PT End of Session - 08/17/21 2009     Visit Number 5    Number of Visits 7    Date for PT Re-Evaluation 08/18/21    Authorization Type BCBS    PT Start Time 1147    PT Stop Time 1231    PT Time Calculation (min) 44 min    Activity Tolerance Patient tolerated treatment well    Behavior During Therapy [facility] for tasks assessed/performed             Past Medical History:  Diagnosis Date   Antiphospholipid syndrome ([place])    Anxiety    CHF (congestive heart failure) ([place])    Depression    Endometriosis    Headache    Hypertension    PE (pulmonary embolism)    2011   Pleural effusion, right    right pleural effusion s/p VATS/pleurodesis '13    Stroke [facility])     Past Surgical History:  Procedure Laterality Date   ABDOMINAL HYSTERECTOMY     ACHILLES TENDON LENGTHENING Left 06/21/2016   Procedure: LEFT ACHILLES LENGTHENING AND LEFT POSTIER TIBALIAS TRANSFER TO LATERAL CUNEIFORM;  Surgeon: Wylene Simmer, MD;  Location: [facility];  Service: Orthopedics;  Laterality: Left;   APPENDECTOMY     PLEURAL BIOPSY      There were no vitals filed for this visit.                      [facility] Adult PT Treatment/Exercise - 08/17/21 0001       Transfers   Transfers Sit to Stand;Stand to Sit    Sit to Stand 4: Min assist    Sit to Stand Details (indicate cue type and reason) Rt foot placed on 2" step for increased LLE weight bearing during transfer    Stand to Sit 4: Min guard    Number of Reps 10 reps    Comments attempted Lt foot on balance bubble but pt stated this was too difficult and had difficulty maintaining balance, had fear of  fall so this was switched to use of 2" block      Ambulation/Gait   Ambulation/Gait Yes    Ambulation Distance (Feet) 50 Feet    Assistive device Small based quad cane    Gait Pattern Decreased step length - left;Decreased hip/knee flexion - left;Decreased weight shift to left   decr. push off due to lack of plantarflexion LLE   Ambulation Surface Level;Indoor      Exercises   Exercises Knee/Hip;Ankle      Knee/Hip Exercises: Stretches   Active Hamstring Stretch Left;1 rep;60 seconds   hamstring/gastroc stretch   Passive Hamstring Stretch Left;1 rep;30 seconds      Knee/Hip Exercises: Standing   Forward Step Up Left;1 set;10 reps;Hand Hold: 1;Step Height: 6"      Knee/Hip Exercises: Seated   Long Arc Quad AROM;Left;1 set;10 reps      Knee/Hip Exercises: Supine   Heel Slides AROM;Left;1 set;10 reps    Bridges AROM;Both;1 set;10 reps    Single Leg Bridge AROM;Left;1 set;10 reps    Straight Leg Raises Strengthening;Left;1 set;10 reps   2#   Other Supine Knee/Hip Exercises bridging with RLE  extension 5 reps:  bridging with marching 5 reps;  bridging with hip abdct/adduction 5 reps      Knee/Hip Exercises: Sidelying   Hip ABduction AAROM;Left;1 set;10 reps    Clams LLE - 10 reps- no weight used    Other Sidelying Knee/Hip Exercises passive Lt hip flexor stretch in Rt sidelying position; Lt ITB tenderness noted with palpation - used foam roller and rolled area with moderate pressure                       PT Short Term Goals - 08/16/21 1153       PT SHORT TERM GOAL #1   Title Independent with HEP    Baseline met 08-08-21    Time 3    Period Weeks    Status Achieved    Target Date 07/28/21      PT SHORT TERM GOAL #2   Title Improve TUG score to </= 17.5 secs with cane for reduced fall risk.    Baseline 20.56 secs with cane;  TUG score 22.91 secs on 08-08-21    Time 3    Period Weeks    Status Not Met    Target Date 07/28/21      PT SHORT TERM GOAL #3    Title Pt will amb. 115' with cane without LOB on flat, even surface.    Baseline met 08-08-21    Time 3    Period Weeks    Status Achieved    Target Date 07/28/21               PT Long Term Goals - 08/17/21 2013       PT LONG TERM GOAL #1   Title Pt will amb. 17' without SPC without LOB with SBA on flat, even surface.    Time 6    Period Weeks    Status New      PT LONG TERM GOAL #2   Title Improve TUG score to </ = 15.5 secs with SPC for reduced fall risk.    Baseline 20.56 secs with SPC    Time 6    Period Weeks    Status New      PT LONG TERM GOAL #3   Title Improve gait velocity to >/= 1.8 ft/sec with SPC for increased gait efficiency.    Baseline 1.3 ft/sec = 23.88 secs    Time 6    Period Weeks    Status New      PT LONG TERM GOAL #4   Title Pt will report at least 25% improvement in Lt foot flat/ contact with floor in standing, i.e. less supination due to incr. tone.    Time 6    Period Weeks    Status New      PT LONG TERM GOAL #5   Title Independent in updated HEP as appropriate.    Time 6    Period Weeks    Status New                    Patient will benefit from skilled therapeutic intervention in order to improve the following deficits and impairments:  Abnormal gait, Decreased balance, Decreased range of motion, Decreased strength, Impaired flexibility, Impaired tone  Visit Diagnosis: Hemiplegia and hemiparesis following cerebral infarction affecting left non-dominant side (HCC)  Other abnormalities of gait and mobility  Unsteadiness on feet     Problem List Patient Active Problem List   Diagnosis  Date Noted   Adjustment disorder with disturbance of emotion 07/01/2016    Alda Lea, PT 08/17/2021, 8:15 PM  [facility] [address] [place], [place], [address] Phone: [phone]   Fax:  [phone]  Name: Denise Crane MRN: [id number] Date of  Birth: 1965-12-24

## 2021-08-22 ENCOUNTER — Encounter: Payer: Self-pay | Admitting: Physical Therapy

## 2021-08-22 ENCOUNTER — Ambulatory Visit: Payer: Medicare Other | Admitting: Physical Therapy

## 2021-08-22 ENCOUNTER — Other Ambulatory Visit: Payer: Self-pay

## 2021-08-22 DIAGNOSIS — R2681 Unsteadiness on feet: Secondary | ICD-10-CM

## 2021-08-22 DIAGNOSIS — I69354 Hemiplegia and hemiparesis following cerebral infarction affecting left non-dominant side: Secondary | ICD-10-CM | POA: Diagnosis not present

## 2021-08-22 DIAGNOSIS — R2689 Other abnormalities of gait and mobility: Secondary | ICD-10-CM

## 2021-08-23 NOTE — Therapy (Signed)
[facility] [address] [place], [place], [address] Phone: [phone]   Fax:  [phone]  Physical Therapy Treatment  Patient Details  Name: Denise Crane MRN: [id number] Date of Birth: [birth year] Referring Provider (PT): Mechele Claude, [place]   Encounter Date: 08/22/2021   PT End of Session - 08/23/21 1522     Visit Number 6    Number of Visits 7    Date for PT Re-Evaluation 08/18/21    Authorization Type BCBS    PT Start Time 1147    PT Stop Time 1233    PT Time Calculation (min) 46 min    Activity Tolerance Patient tolerated treatment well    Behavior During Therapy [facility] for tasks assessed/performed             Past Medical History:  Diagnosis Date   Antiphospholipid syndrome ([place])    Anxiety    CHF (congestive heart failure) ([place])    Depression    Endometriosis    Headache    Hypertension    PE (pulmonary embolism)    2011   Pleural effusion, right    right pleural effusion s/p VATS/pleurodesis '13    Stroke [facility])     Past Surgical History:  Procedure Laterality Date   ABDOMINAL HYSTERECTOMY     ACHILLES TENDON LENGTHENING Left 06/21/2016   Procedure: LEFT ACHILLES LENGTHENING AND LEFT POSTIER TIBALIAS TRANSFER TO LATERAL CUNEIFORM;  Surgeon: Wylene Simmer, MD;  Location: [facility];  Service: Orthopedics;  Laterality: Left;   APPENDECTOMY     PLEURAL BIOPSY      There were no vitals filed for this visit.   Subjective Assessment - 08/22/21 1155     Subjective Pt reports Dr. Doran Durand gave her a wedge for her shoe to get more foot contact in standing - states she thinks it helps some    Pertinent History h/o CVA with spastic hemiparesis 2011, Lt hammertoe repair digits 2-5 on 01-19-21, Lt Achilles lengthening & Lt posterior tibialis transfer Sept. 2017, HTN, PE 2011    Limitations Standing;Walking    Patient Stated Goals "Improve balance and get my left foot flatter"  - wants a flatter arch in Lt foot     Currently in Pain? No/denies    Pain Onset In the past 7 days                               [facility] Adult PT Treatment/Exercise - 08/23/21 0001       Transfers   Transfers Sit to Stand;Stand to Sit    Sit to Stand 4: Min assist    Sit to Stand Details (indicate cue type and reason) Rt foot placed on 2" block    Stand to Sit 4: Min guard    Number of Reps 10 reps      Ambulation/Gait   Ambulation/Gait Yes    Ambulation/Gait Assistance 4: Min guard    Ambulation Distance (Feet) 60 Feet    Assistive device None    Gait Pattern Decreased step length - left;Decreased hip/knee flexion - left;Decreased weight shift to left   decr. push off due to lack of plantarflexion LLE   Ambulation Surface Level;Indoor    Stairs Yes    Stairs Assistance 5: Supervision    Stair Management Technique One rail Right    Number of Stairs 4    Height of Stairs 6      Knee/Hip  Exercises: Stretches   Active Hamstring Stretch Left;1 rep;30 seconds   Runner's stretch   Passive Hamstring Stretch Left;1 rep;30 seconds      Knee/Hip Exercises: Machines for Strengthening   Cybex Leg Press 50# bil. LE's 20 reps:  30# LLE 20 reps with mod assist for eccentric control      Knee/Hip Exercises: Seated   Long Arc Quad AROM;Left;1 set;10 reps      Knee/Hip Exercises: Supine   Heel Slides AROM;Left;1 set;10 reps    Bridges AROM;Both;1 set;10 reps    Single Leg Bridge AROM;Left;1 set;10 reps    Straight Leg Raises Strengthening;Left;1 set;10 reps   2# on LLE   Other Supine Knee/Hip Exercises bridging with RLE extension 5 reps:  bridging with marching 5 reps;  bridging with hip abdct/adduction 5 reps              NeuroRe-ed:  tap ups with RLE to 1st step 10 reps for increased weight shift onto LLE - with min UE support on rail prn  Assist with balance         PT Short Term Goals - 08/23/21 1529       PT SHORT TERM GOAL #1   Title Independent with HEP    Baseline met 08-08-21     Time 3    Period Weeks    Status Achieved    Target Date 07/28/21      PT SHORT TERM GOAL #2   Title Improve TUG score to </= 17.5 secs with cane for reduced fall risk.    Baseline 20.56 secs with cane;  TUG score 22.91 secs on 08-08-21    Time 3    Period Weeks    Status Not Met    Target Date 07/28/21      PT SHORT TERM GOAL #3   Title Pt will amb. 115' with cane without LOB on flat, even surface.    Baseline met 08-08-21    Time 3    Period Weeks    Status Achieved    Target Date 07/28/21               PT Long Term Goals - 08/23/21 1529       PT LONG TERM GOAL #1   Title Pt will amb. 66' without SPC without LOB with SBA on flat, even surface.    Time 6    Period Weeks    Status New      PT LONG TERM GOAL #2   Title Improve TUG score to </ = 15.5 secs with SPC for reduced fall risk.    Baseline 20.56 secs with SPC    Time 6    Period Weeks    Status New      PT LONG TERM GOAL #3   Title Improve gait velocity to >/= 1.8 ft/sec with SPC for increased gait efficiency.    Baseline 1.3 ft/sec = 23.88 secs    Time 6    Period Weeks    Status New      PT LONG TERM GOAL #4   Title Pt will report at least 25% improvement in Lt foot flat/ contact with floor in standing, i.e. less supination due to incr. tone.    Time 6    Period Weeks    Status New      PT LONG TERM GOAL #5   Title Independent in updated HEP as appropriate.    Time 6    Period Weeks  Status New                   Plan - 08/23/21 1523     Clinical Impression Statement Skilled session focused on stretching and strengthening exercises for LLE with continued tactile and verbal cues for weight shifting onto LLE during sit to stand transfer.  Pt's Lt foot continues to be in dorsiflexed position upon initial standing with majority of her weight in the heel with increased weight shift onto RLE.  Pt did amb. without device approx. 59' at end of session today. Cont with POC - plan D/C  next session.    Personal Factors and Comorbidities Comorbidity 2;Time since onset of injury/illness/exacerbation;Past/Current Experience;Fitness    Comorbidities h/o CVA in 2011 with Lt spastic hemiparesis, Lt hammertoe repair digits 2-5 Lt foot (01-19-21), CHF, depression, HTN, PE 2011; Lt Achilles lengthening Sept. 2017    Examination-Activity Limitations Locomotion Level;Transfers;Squat;Stairs;Stand    Examination-Participation Restrictions Cleaning;Community Activity;Laundry;Shop;Meal Prep    Stability/Clinical Decision Making Evolving/Moderate complexity    Rehab Potential Good    PT Frequency 1x / week    PT Duration 6 weeks    PT Treatment/Interventions ADLs/Self Care Home Management;Aquatic Therapy;Gait training;Stair training;Therapeutic activities;Therapeutic exercise;Balance training;Patient/family education;Neuromuscular re-education;Manual techniques;Passive range of motion    PT Next Visit Plan cont LLE strengthening exercises    PT Home Exercise Plan Medbridge    Consulted and Agree with Plan of Care Patient             Patient will benefit from skilled therapeutic intervention in order to improve the following deficits and impairments:  Abnormal gait, Decreased balance, Decreased range of motion, Decreased strength, Impaired flexibility, Impaired tone  Visit Diagnosis: Hemiplegia and hemiparesis following cerebral infarction affecting left non-dominant side (HCC)  Other abnormalities of gait and mobility  Unsteadiness on feet     Problem List Patient Active Problem List   Diagnosis Date Noted   Adjustment disorder with disturbance of emotion 07/01/2016    Alda Lea, PT 08/23/2021, 3:30 PM  [facility] [address] [place], [place], [address] Phone: [phone]   Fax:  [phone]  Name: Denise Crane MRN: [id number] Date of Birth: [birth year]

## 2021-08-29 ENCOUNTER — Ambulatory Visit: Payer: Medicare Other | Attending: Student | Admitting: Physical Therapy

## 2021-08-29 ENCOUNTER — Other Ambulatory Visit: Payer: Self-pay

## 2021-08-29 ENCOUNTER — Encounter: Payer: Self-pay | Admitting: Physical Therapy

## 2021-08-29 DIAGNOSIS — R2689 Other abnormalities of gait and mobility: Secondary | ICD-10-CM

## 2021-08-29 DIAGNOSIS — I69354 Hemiplegia and hemiparesis following cerebral infarction affecting left non-dominant side: Secondary | ICD-10-CM | POA: Diagnosis not present

## 2021-08-29 DIAGNOSIS — R2681 Unsteadiness on feet: Secondary | ICD-10-CM | POA: Diagnosis present

## 2021-08-30 ENCOUNTER — Encounter: Payer: Self-pay | Admitting: Physical Therapy

## 2021-08-30 NOTE — Therapy (Signed)
[facility] [address] [place], [place], [address] Phone: [phone]   Fax:  [phone]  Physical Therapy Treatment & Discharge Summary  Patient Details  Name: Denise Crane MRN: [id number] Date of Birth: September 18, 1966 Referring Provider (PT): Mechele Claude, [place]   Encounter Date: 08/29/2021   PT End of Session - 08/30/21 1349     Visit Number 7    Number of Visits 7    Date for PT Re-Evaluation 08/18/21    Authorization Type BCBS    PT Start Time 1148    PT Stop Time 1232    PT Time Calculation (min) 44 min    Activity Tolerance Patient tolerated treatment well    Behavior During Therapy [facility] for tasks assessed/performed             Past Medical History:  Diagnosis Date   Antiphospholipid syndrome ([place])    Anxiety    CHF (congestive heart failure) ([place])    Depression    Endometriosis    Headache    Hypertension    PE (pulmonary embolism)    2011   Pleural effusion, right    right pleural effusion s/p VATS/pleurodesis '13    Stroke [facility])     Past Surgical History:  Procedure Laterality Date   ABDOMINAL HYSTERECTOMY     ACHILLES TENDON LENGTHENING Left 06/21/2016   Procedure: LEFT ACHILLES LENGTHENING AND LEFT POSTIER TIBALIAS TRANSFER TO LATERAL CUNEIFORM;  Surgeon: Wylene Simmer, MD;  Location: [facility];  Service: Orthopedics;  Laterality: Left;   APPENDECTOMY     PLEURAL BIOPSY      There were no vitals filed for this visit.   Subjective Assessment - 08/29/21 1145     Subjective Pt reports her Lt foot is flatter - is doing better; may be a combination of everything - the stretches, the use of the wedge and states she has really been working on it    Pertinent History h/o CVA with spastic hemiparesis 2011, Lt hammertoe repair digits 2-5 on 01-19-21, Lt Achilles lengthening & Lt posterior tibialis transfer Sept. 2017, HTN, PE 2011    Patient Stated Goals "Improve balance and get my left foot flatter"  -  wants a flatter arch in Lt foot    Currently in Pain? No/denies                               [facility] Adult PT Treatment/Exercise - 08/30/21 0001       Transfers   Stand to Sit 4: Min guard    Number of Reps Other reps (comment)   RUE support used from mat     Ambulation/Gait   Ambulation/Gait Yes    Ambulation/Gait Assistance 4: Min guard;5: Supervision    Ambulation Distance (Feet) 115 Feet    Assistive device None    Gait Pattern Decreased step length - left;Decreased hip/knee flexion - left;Decreased weight shift to left   decr. push off due to lack of plantarflexion LLE   Ambulation Surface Level;Indoor    Gait velocity 20.91 secs = 1.57 ft/sec with SBQC    Stairs Yes    Stairs Assistance 5: Supervision    Stair Management Technique One rail Right    Number of Stairs 4    Height of Stairs 6      Standardized Balance Assessment   Standardized Balance Assessment Timed Up and Go Test      Timed Up  and Go Test   TUG Normal TUG    Normal TUG (seconds) 18.82   secs with quad cane     Knee/Hip Exercises: Standing   Hip Flexion Left;Stengthening;2 sets;10 reps;Knee bent;Knee straight   2# weight used - 10 reps with knee extended; 10 reps knee flexed   Hip Abduction Stengthening;Left;1 set;10 reps   2# weight used   Hip Extension Stengthening;Left;1 set;10 reps   2# weight used   Forward Step Up Left;1 set;10 reps;Hand Hold: 1;Step Height: 6"                     PT Education - 08/30/21 1347     Education Details reviewed HEP - discussed difficulty performing bridging on bed at home due to soft surface - substituted hip extension in standing with 2# weight;  reviewed LTG's and progress towards goals    Person(s) Educated Patient    Methods Explanation;Demonstration    Comprehension Verbalized understanding;Returned demonstration              PT Short Term Goals - 08/30/21 1343       PT SHORT TERM GOAL #1   Title Independent with HEP     Baseline met 08-08-21    Time 3    Period Weeks    Status Achieved    Target Date 07/28/21      PT SHORT TERM GOAL #2   Title Improve TUG score to </= 17.5 secs with cane for reduced fall risk.    Baseline 20.56 secs with cane;  TUG score 22.91 secs on 08-08-21    Time 3    Period Weeks    Status Not Met    Target Date 07/28/21      PT SHORT TERM GOAL #3   Title Pt will amb. 115' with cane without LOB on flat, even surface.    Baseline met 08-08-21    Time 3    Period Weeks    Status Achieved    Target Date 07/28/21               PT Long Term Goals - 08/29/21 1147       PT LONG TERM GOAL #1   Title Pt will amb. 67' without SPC without LOB with SBA on flat, even surface.    Baseline 115' with no device - 08-29-21 - goal met    Time 6    Period Weeks    Status Achieved      PT LONG TERM GOAL #2   Title Improve TUG score to </ = 15.5 secs with SPC for reduced fall risk.    Baseline 20.56 secs with SPC;  22.06 1st trial, 18.82 secs with SBQC    Time 6    Period Weeks    Status Not Met      PT LONG TERM GOAL #3   Title Improve gait velocity to >/= 1.8 ft/sec with SPC for increased gait efficiency.    Baseline 1.3 ft/sec = 23.88 secs;  20.91 secs = 1.57 ft/sec with [facility] - 08-29-21    Time 6    Period Weeks    Status Partially Met      PT LONG TERM GOAL #4   Title Pt will report at least 25% improvement in Lt foot flat/ contact with floor in standing, i.e. less supination due to incr. tone.    Baseline pt reports Lt foot is "alot flatter than it was initially" - goal met 08-29-21  Time 6    Period Weeks    Status Achieved      PT LONG TERM GOAL #5   Title Independent in updated HEP as appropriate.    Baseline goal met 08-29-21    Time 6    Period Weeks    Status Achieved                   Plan - 08/30/21 1351     Clinical Impression Statement Pt has met LTG's #1, 4 and 5:  LTG #2 not met as TUG score = 18.82 secs with SBQC; LTG #3 not met as  gait velocity = 1.57 ft/sec and not 1.8 ft/sec per stated goal.  Pt does report Lt foot feels flatter in shoe (less supination due to increased tone); pt is discharged due to completion of program and plateau in maximizing functional progress at this time. Pt agrees with D/C.    Personal Factors and Comorbidities Comorbidity 2;Time since onset of injury/illness/exacerbation;Past/Current Experience;Fitness    Comorbidities h/o CVA in 2011 with Lt spastic hemiparesis, Lt hammertoe repair digits 2-5 Lt foot (01-19-21), CHF, depression, HTN, PE 2011; Lt Achilles lengthening Sept. 2017    Examination-Activity Limitations Locomotion Level;Transfers;Squat;Stairs;Stand    Examination-Participation Restrictions Cleaning;Community Activity;Laundry;Shop;Meal Prep    Stability/Clinical Decision Making Evolving/Moderate complexity    Rehab Potential Good    PT Frequency 1x / week    PT Duration 6 weeks    PT Treatment/Interventions ADLs/Self Care Home Management;Aquatic Therapy;Gait training;Stair training;Therapeutic activities;Therapeutic exercise;Balance training;Patient/family education;Neuromuscular re-education;Manual techniques;Passive range of motion    PT Next Visit Plan D/C 08-29-21    PT Home Exercise Plan Medbridge    Consulted and Agree with Plan of Care Patient             Patient will benefit from skilled therapeutic intervention in order to improve the following deficits and impairments:  Abnormal gait, Decreased balance, Decreased range of motion, Decreased strength, Impaired flexibility, Impaired tone  Visit Diagnosis: Hemiplegia and hemiparesis following cerebral infarction affecting left non-dominant side (HCC)  Other abnormalities of gait and mobility  Unsteadiness on feet     Problem List Patient Active Problem List   Diagnosis Date Noted   Adjustment disorder with disturbance of emotion 07/01/2016     PHYSICAL THERAPY DISCHARGE SUMMARY  Visits from Start of Care:  7  Current functional level related to goals / functional outcomes: See above for progress towards goals   Remaining deficits: Cont. Lt hemiparesis with spasticity/increased extensor tone Continued dependency with gait with pt using SBQC for assistance with community ambulation; pt reports she does not use cane in her home Continued supination of Lt foot but pt reports this has improved since initial eval in Oct.   Education / Equipment: Pt has been instructed in HEP for LLE strengthening and stretching exercises  Patient agrees to discharge. Patient goals were partially met. Patient is being discharged due to meeting the stated rehab goals.  Pt has maximized functional progress at this time - no further needs identified.  NOMVEH, MCNOB [id number], PT 08/30/2021, 2:02 PM  [facility] [address] [place], [place], [address] Phone: [phone]   Fax:  [phone]  Name: [place] Hedman MRN: [id number] Date of Birth: 02/02/1966

## 2021-09-19 ENCOUNTER — Other Ambulatory Visit: Payer: Self-pay | Admitting: Physician Assistant

## 2021-09-26 ENCOUNTER — Other Ambulatory Visit: Payer: Self-pay | Admitting: Physician Assistant

## 2022-05-08 ENCOUNTER — Ambulatory Visit: Payer: Medicare Other | Admitting: Podiatry

## 2022-05-08 DIAGNOSIS — R2681 Unsteadiness on feet: Secondary | ICD-10-CM | POA: Diagnosis not present

## 2022-05-08 NOTE — Progress Notes (Signed)
   HPI: 56 y.o. female presenting today PMHx stroke 2011 on Eliquis 5 mg daily presenting for follow-up evaluation and a new complaint regarding unsteadiness of gait.  Patient states that when she walks she feels very unsteady.  She believes it is because her arch is too high after the tendon transfer surgery.  Past Medical History:  Diagnosis Date   Antiphospholipid syndrome (HCC)    Anxiety    CHF (congestive heart failure) (HCC)    Depression    Endometriosis    Headache    Hypertension    PE (pulmonary embolism)    2011   Pleural effusion, right    right pleural effusion s/p VATS/pleurodesis '13    Stroke [facility])    Past Surgical History:  Procedure Laterality Date   ABDOMINAL HYSTERECTOMY     ACHILLES TENDON LENGTHENING Left 06/21/2016   Procedure: LEFT ACHILLES LENGTHENING AND LEFT POSTIER TIBALIAS TRANSFER TO LATERAL CUNEIFORM;  Surgeon: Toni Arthurs, MD;  Location: [facility] OR;  Service: Orthopedics;  Laterality: Left;   APPENDECTOMY     PLEURAL BIOPSY     Allergies  Allergen Reactions   Benzoyl Peroxide Itching, Swelling and Rash   Latex Itching and Other (See Comments)    Latex surgical tape   Ace Inhibitors Cough     Objective: Physical Exam General: The patient is alert and oriented x3 in no acute distress.  Dermatology: Skin is cool, dry and supple bilateral lower extremities. Negative for open lesions or macerations.  Vascular: Palpable pedal pulses bilaterally. No edema or erythema noted. Capillary refill within normal limits.  Neurological: Epicritic and protective threshold grossly intact bilaterally.   Musculoskeletal Exam: Today there is some limited range of motion to the left foot in general..  Prior history of PT tendon transfer and hammertoe correction.  Radiographic Exam: There is some residual recurrent hammertoe especially to the fourth and fifth digit of the left foot.  Inability for plantarflexion.  The foot appears to be more in a fixed  dorsiflexion/eversion position secondary to the tendon transfer   Assessment: 1.  Hammertoes 2-5 left 2.  History of posterior tibial tendon transfer LLE   Plan of Care:  1. Patient evaluated.  2.  After discussing with the patient I do believe the best long-term solution for her altered gait to create more stability would be custom molded orthotics.  This would help support the medial longitudinal arch of the foot and potentially stabilize her foot in general.  Patient agrees 3.  Appointment with orthotics department for custom molded orthotics 4.  Return to clinic 6 weeks after wearing the orthotics for follow-up      Felecia Shelling, DPM [facility]  Dr. Felecia Shelling, DPM    [address] [place], [place] [address]                Office [phone]  Fax [phone]

## 2022-05-14 ENCOUNTER — Other Ambulatory Visit: Payer: Medicare Other

## 2022-06-22 IMAGING — MG DIGITAL SCREENING BILAT W/ TOMO W/ CAD
8 series · 8 of 24 positions shown · non-contrast
Comparison: Previous exam(s).

CLINICAL DATA: Screening.

EXAM:
DIGITAL SCREENING BILATERAL MAMMOGRAM WITH TOMO AND CAD

[R MLO synth-2D]
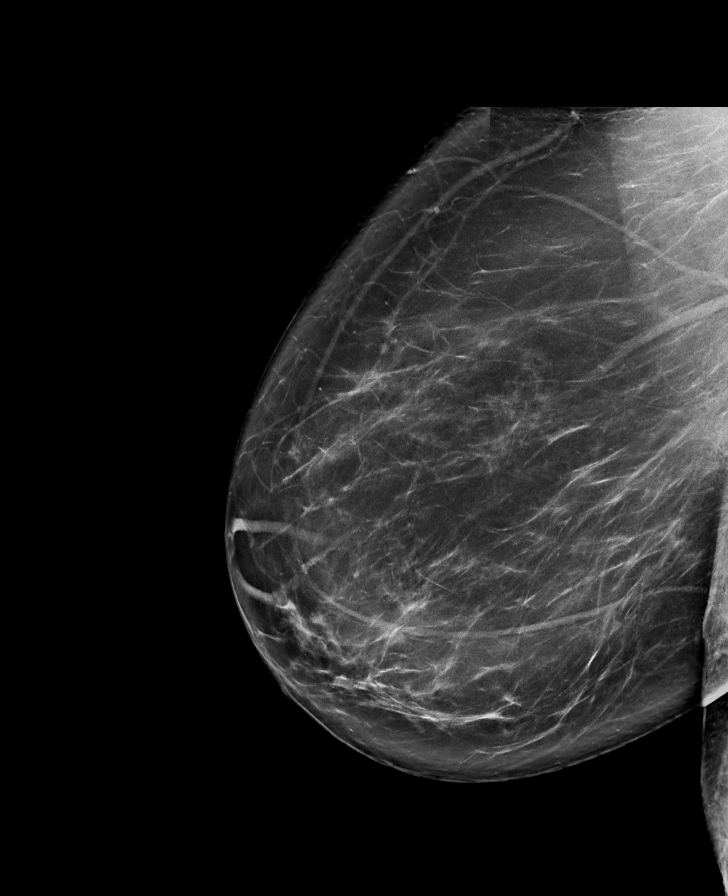

[R CC synth-2D]
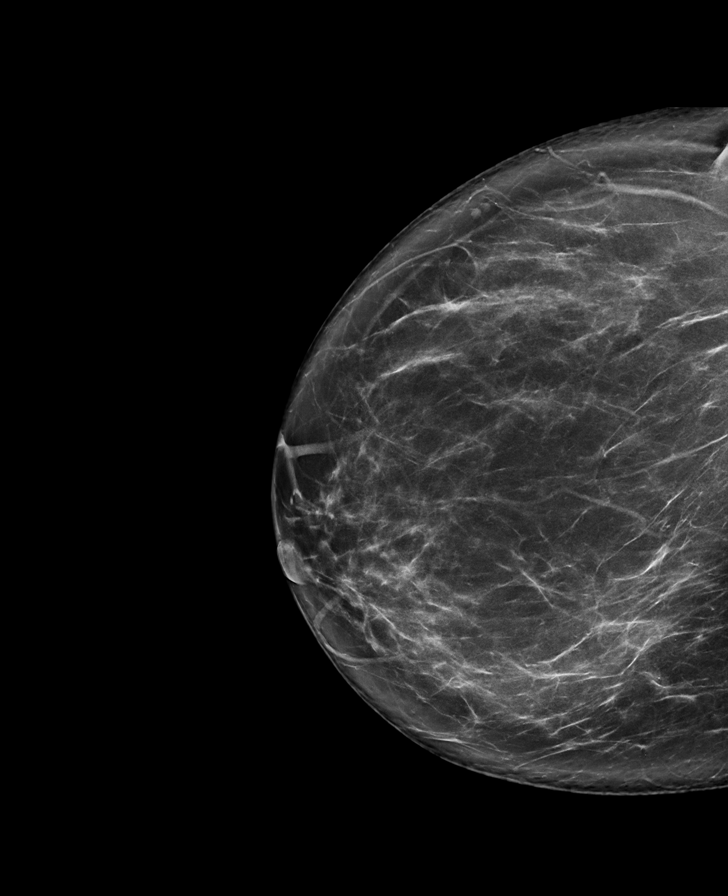

[L MLO synth-2D]
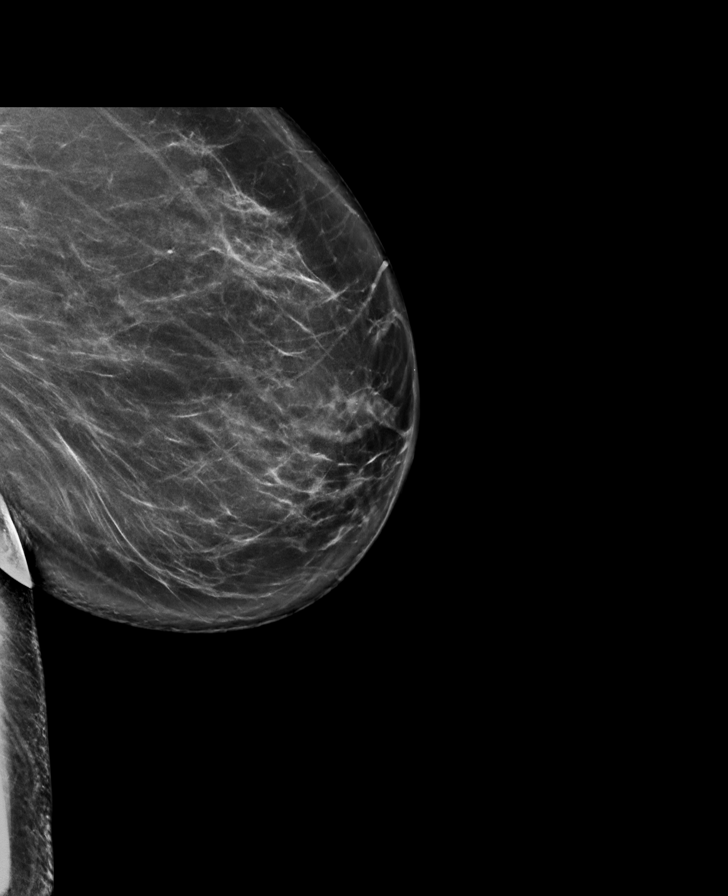

[L CC synth-2D]
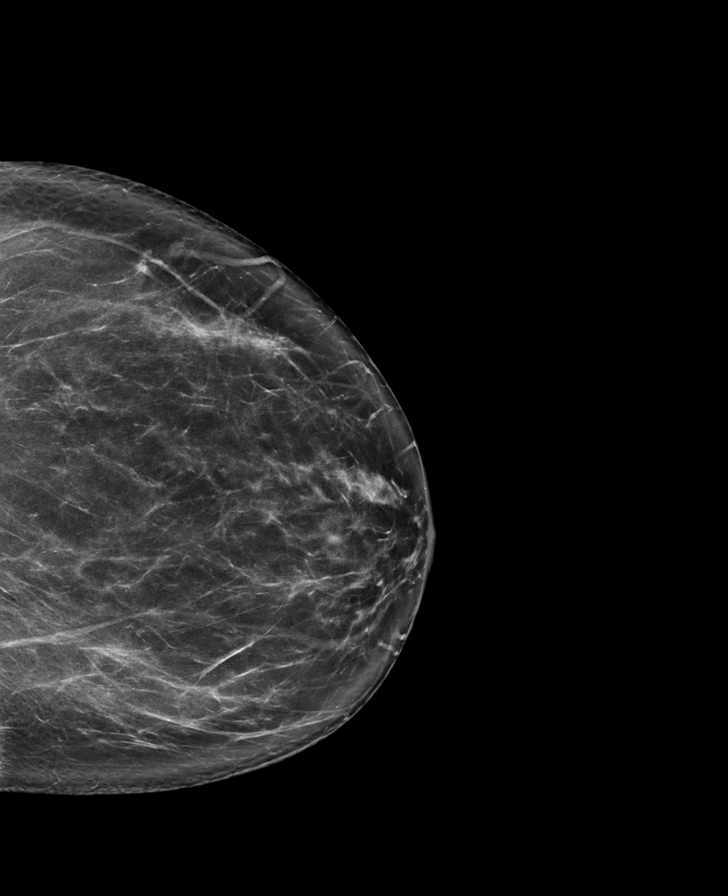

[R CC tomo · tomo slice 45/89.0]
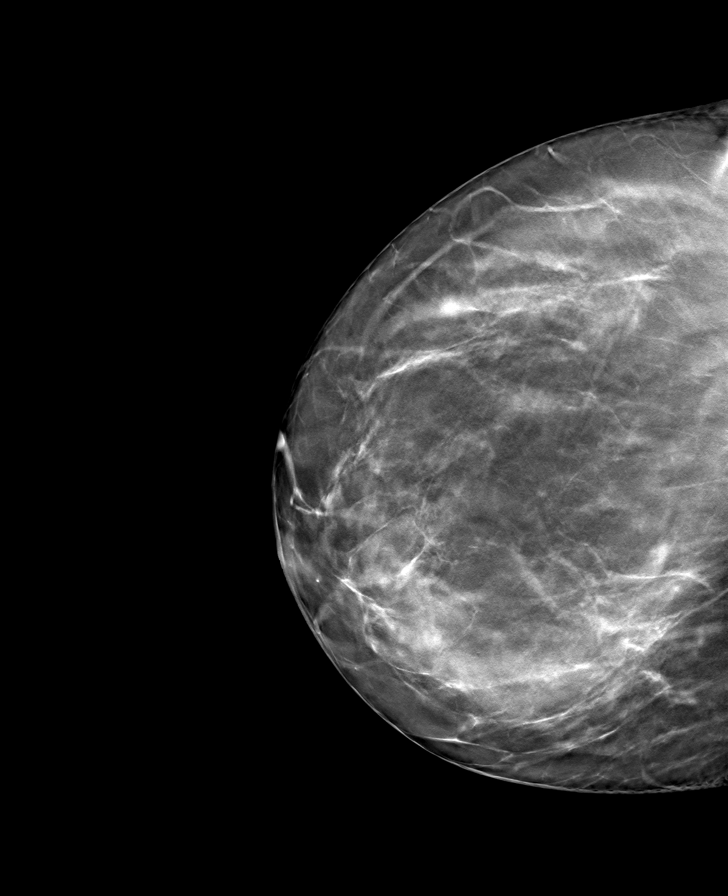

[L MLO tomo · tomo slice 47/92.0]
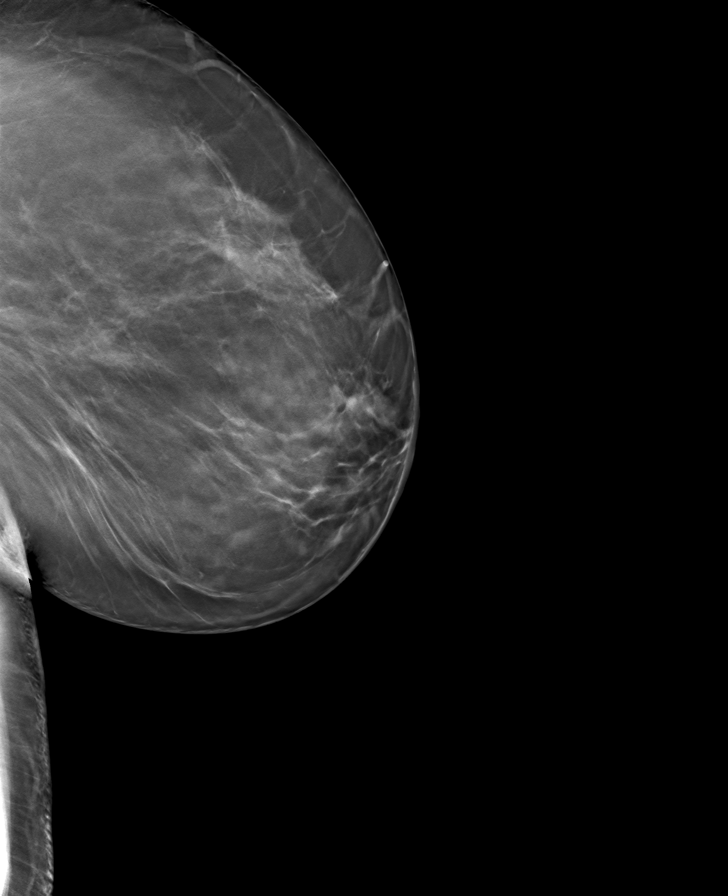

[R MLO tomo · tomo slice 49/97.0]
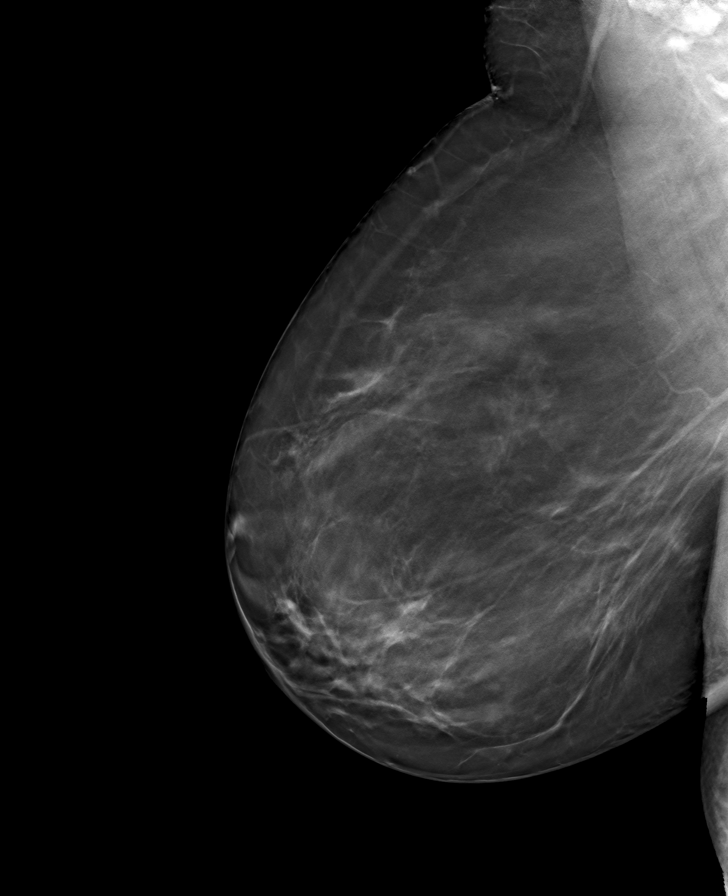

[L CC tomo · tomo slice 44/87.0]
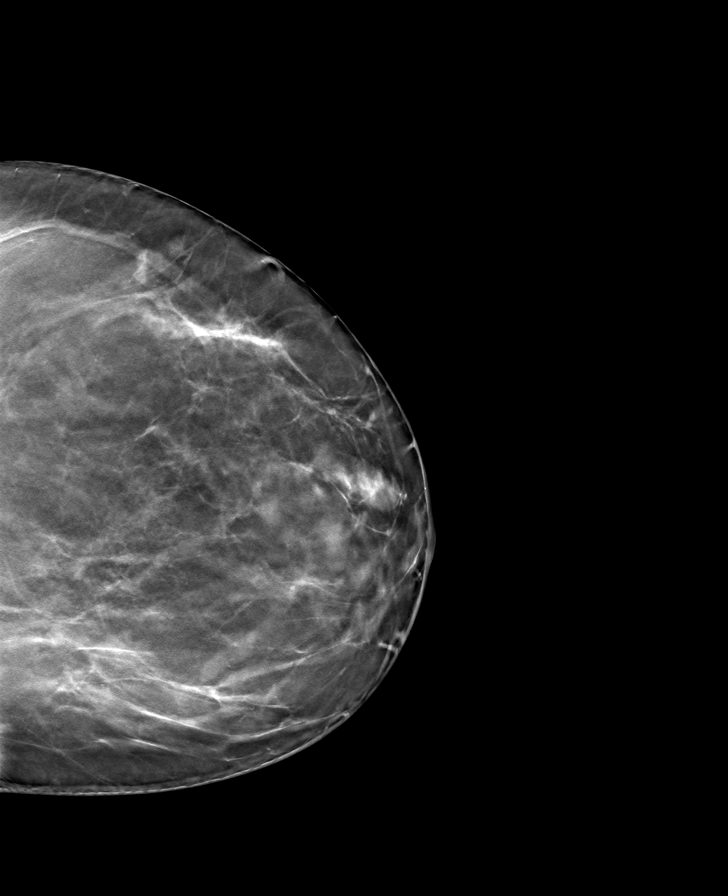

[8 of 24 positions shown; findings below may reference images not displayed]

ACR Breast Density Category b: There are scattered areas of
fibroglandular density.
FINDINGS: There are no findings suspicious for malignancy. Images were
processed with CAD.
IMPRESSION: No mammographic evidence of malignancy. A result letter of this
screening mammogram will be mailed directly to the patient.

RECOMMENDATION:
Screening mammogram in one year. (Code:[id number])

BI-RADS CATEGORY  1: Negative.

## 2022-07-18 ENCOUNTER — Ambulatory Visit (INDEPENDENT_AMBULATORY_CARE_PROVIDER_SITE_OTHER): Payer: Medicare Other

## 2022-07-18 DIAGNOSIS — R2681 Unsteadiness on feet: Secondary | ICD-10-CM

## 2022-07-18 NOTE — Progress Notes (Signed)
Patient presents today to be casted for custom molded orthotics. Dr. Amalia Hailey has been treating patient for unsteady gait.   Impression foam cast was taken. ABN signed.  Patient info-  Shoe size: 9.5 wide women's  Shoe style: Athletic shoe   Weight: 197 lbs   Insurance: [employer]- Medicare   Patient will be notified once orthotics arrive in office and reappoint for fitting at that time.

## 2022-09-05 ENCOUNTER — Ambulatory Visit (INDEPENDENT_AMBULATORY_CARE_PROVIDER_SITE_OTHER): Payer: Medicare Other

## 2022-09-05 DIAGNOSIS — R2681 Unsteadiness on feet: Secondary | ICD-10-CM

## 2022-09-05 NOTE — Progress Notes (Signed)
Patient presents today to pick up custom molded foot orthotics, diagnosed with unsteady gait when walking by Dr. Logan Bores.   Orthotics were dispensed and fit was satisfactory. Reviewed instructions for break-in and wear. Written instructions given to patient.  Patient will follow up as needed.   [facility] - order # [id number]

## 2022-09-19 ENCOUNTER — Ambulatory Visit (INDEPENDENT_AMBULATORY_CARE_PROVIDER_SITE_OTHER): Payer: Medicare Other

## 2022-09-19 ENCOUNTER — Telehealth: Payer: Self-pay

## 2022-09-19 DIAGNOSIS — R2681 Unsteadiness on feet: Secondary | ICD-10-CM

## 2022-09-19 NOTE — Progress Notes (Signed)
Patient presents today with concerns about her right orthotic. She stated that she was doing routine care for her feet and noticed a spot of blood on the q-tip after going between her toes. After looking at her foot I did not see anything nor was there any spot. She will continue to monitor her feet daily and will contact us in the event she notices this again.

## 2022-09-19 NOTE — Telephone Encounter (Signed)
Noted, thanks!

## 2022-10-03 ENCOUNTER — Telehealth: Payer: Self-pay | Admitting: Podiatry

## 2022-10-03 NOTE — Telephone Encounter (Signed)
Spoke with patient this morning she states that the orthotics are not working for her, they do not help balance her and she feels that she is going to fall, do you think they can be adjust or what would be your recommendations ?

## 2022-10-03 NOTE — Telephone Encounter (Signed)
Go ahead and contact the patient and have her come in for follow up appointment so I can evaluate her and look at her orthotics.  Thanks, Dr. Amalia Hailey

## 2022-12-07 ENCOUNTER — Other Ambulatory Visit: Payer: Self-pay

## 2022-12-07 ENCOUNTER — Encounter (HOSPITAL_COMMUNITY): Payer: Self-pay | Admitting: Emergency Medicine

## 2022-12-07 ENCOUNTER — Emergency Department (HOSPITAL_COMMUNITY)
Admission: EM | Admit: 2022-12-07 | Discharge: 2022-12-07 | Disposition: A | Discharge status: Home / Self Care | Payer: Medicare Other | Attending: Emergency Medicine | Admitting: Emergency Medicine

## 2022-12-07 DIAGNOSIS — Z7901 Long term (current) use of anticoagulants: Secondary | ICD-10-CM | POA: Insufficient documentation

## 2022-12-07 DIAGNOSIS — R45851 Suicidal ideations: Secondary | ICD-10-CM | POA: Insufficient documentation

## 2022-12-07 DIAGNOSIS — I2510 Atherosclerotic heart disease of native coronary artery without angina pectoris: Secondary | ICD-10-CM | POA: Diagnosis not present

## 2022-12-07 DIAGNOSIS — I110 Hypertensive heart disease with heart failure: Secondary | ICD-10-CM | POA: Insufficient documentation

## 2022-12-07 DIAGNOSIS — R45850 Homicidal ideations: Secondary | ICD-10-CM | POA: Insufficient documentation

## 2022-12-07 DIAGNOSIS — Z7982 Long term (current) use of aspirin: Secondary | ICD-10-CM | POA: Insufficient documentation

## 2022-12-07 DIAGNOSIS — I509 Heart failure, unspecified: Secondary | ICD-10-CM | POA: Insufficient documentation

## 2022-12-07 DIAGNOSIS — F4322 Adjustment disorder with anxiety: Secondary | ICD-10-CM

## 2022-12-07 DIAGNOSIS — F4325 Adjustment disorder with mixed disturbance of emotions and conduct: Secondary | ICD-10-CM | POA: Diagnosis not present

## 2022-12-07 DIAGNOSIS — Z91040 Latex allergy status: Secondary | ICD-10-CM | POA: Insufficient documentation

## 2022-12-07 DIAGNOSIS — F4329 Adjustment disorder with other symptoms: Secondary | ICD-10-CM | POA: Diagnosis present

## 2022-12-07 DIAGNOSIS — Z8659 Personal history of other mental and behavioral disorders: Secondary | ICD-10-CM

## 2022-12-07 DIAGNOSIS — Z8673 Personal history of transient ischemic attack (TIA), and cerebral infarction without residual deficits: Secondary | ICD-10-CM | POA: Insufficient documentation

## 2022-12-07 DIAGNOSIS — Z79899 Other long term (current) drug therapy: Secondary | ICD-10-CM | POA: Insufficient documentation

## 2022-12-07 LAB — BASIC METABOLIC PANEL
Anion gap: 10 (ref 5–15)
BUN: 15 mg/dL (ref 6–20)
CO2: 24 mmol/L (ref 22–32)
Calcium: 9.2 mg/dL (ref 8.9–10.3)
Chloride: 106 mmol/L (ref 98–111)
Creatinine, Ser: 0.72 mg/dL (ref 0.44–1.00)
GFR, Estimated: >60 mL/min (ref >60)
Glucose, Bld: 102 mg/dL — ABNORMAL HIGH (ref 70–99)
Potassium: 3.5 mmol/L (ref 3.5–5.1)
Sodium: 140 mmol/L (ref 135–145)

## 2022-12-07 LAB — CBC
HCT: 38.7 % (ref 36.0–46.0)
Hemoglobin: 12.1 g/dL (ref 12.0–15.0)
MCH: 27.3 pg (ref 26.0–34.0)
MCHC: 31.3 g/dL (ref 30.0–36.0)
MCV: 87.2 fL (ref 80.0–100.0)
Platelets: 233 10*3/uL (ref 150–400)
RBC: 4.44 MIL/uL (ref 3.87–5.11)
RDW: 14.1 % (ref 11.5–15.5)
WBC: 6.4 10*3/uL (ref 4.0–10.5)
nRBC: 0 % (ref 0.0–0.2)

## 2022-12-07 LAB — ETHANOL: Alcohol, Ethyl (B): <10 mg/dL (ref <10)

## 2022-12-07 LAB — ACETAMINOPHEN LEVEL: Acetaminophen (Tylenol), Serum: <10 ug/mL — ABNORMAL LOW (ref 10–30)

## 2022-12-07 LAB — SALICYLATE LEVEL: Salicylate Lvl: <7 mg/dL — ABNORMAL LOW (ref 7.0–30.0)

## 2022-12-07 MED ORDER — LORAZEPAM 1 MG PO TABS: 1.0000 mg | ORAL_TABLET | Freq: Four times a day (QID) | ORAL | Status: DC | PRN

## 2022-12-07 NOTE — ED Provider Notes (Signed)
[facility] Provider Note   CSN: [id number] Arrival date & time: 12/07/22  1219     History  Chief Complaint  Patient presents with   Suicidal   Homicidal    Denise Crane is a 57 y.o. female with past medical history significant for hypertension, CHF, previous stroke, PE, anxiety, depression who presents with concern for homicidal ideation, and suicidal ideation.  Patient reports that she separated from her husband, she was staying at a nursing facility to help deal with her weakness secondary to stroke but she could no longer afford it, moved in with her parents.  She reports that she hates living with her parents and wishes that her mother would die.  She reports that her problems are mostly situational based on her living arrangement at this time but cannot say that she would not feel SI or HI removed from the situation.  She denies any AVH.  She reports that she has been taking her anxiety, depression medication without missing any treatments.  HPI     Home Medications Prior to Admission medications   Medication Sig Start Date End Date Taking? Authorizing Provider  aspirin EC 81 MG tablet Take 81 mg by mouth at bedtime.  02/14/10   [provider]  aspirin-acetaminophen-caffeine (EXCEDRIN MIGRAINE) [phone] MG tablet Take 2 tablets every 6 (six) hours as needed by mouth for headache.    [provider]  carvedilol (COREG) 25 MG tablet Take 25 mg by mouth 2 (two) times daily with a meal. 09/30/18   [provider]  dantrolene (DANTRIUM) 25 MG capsule Take 25 mg by mouth 2 (two) times daily. 10/01/18   [provider]  ELIQUIS 5 MG TABS tablet Take 5 mg by mouth 2 (two) times daily. 11/01/18   [provider]  escitalopram (LEXAPRO) 10 MG tablet Take 10 mg by mouth daily. Take along with 5 mg tablet 11/01/18   [provider]  escitalopram (LEXAPRO) 5 MG tablet Take 5 mg by mouth daily.   11/01/18   [provider]  Multiple Vitamins-Minerals (MULTIVITAMIN ADULTS PO) Take 1 tablet by mouth daily.    [provider]  oxyCODONE-acetaminophen (PERCOCET) 5-325 MG tablet Take 1 tablet by mouth every 4 (four) hours as needed for severe pain. 01/19/21   Edrick Kins, DPM  valsartan-hydrochlorothiazide (DIOVAN-HCT) 160-12.5 MG tablet Take 1 tablet by mouth daily.  09/30/18   [provider]      Allergies    Benzoyl peroxide, Latex, and Ace inhibitors    Review of Systems   Review of Systems  Psychiatric/Behavioral:  Positive for behavioral problems and dysphoric mood. The patient is nervous/anxious.   All other systems reviewed and are negative.   Physical Exam Updated Vital Signs BP 121/81 (BP Location: Left Arm)   Pulse 66   Temp 98.2 F (36.8 C) (Oral)   Resp 18   Ht 5\' 4"  (1.626 m)   Wt 76.2 kg   SpO2 97%   BMI 28.84 kg/m  Physical Exam Vitals and nursing note reviewed.  Constitutional:      General: She is not in acute distress.    Appearance: Normal appearance.  HENT:     Head: Normocephalic and atraumatic.  Eyes:     General:        Right eye: No discharge.        Left eye: No discharge.  Cardiovascular:     Rate and Rhythm: Normal rate and  regular rhythm.     Heart sounds: No murmur heard.    No friction rub. No gallop.  Pulmonary:     Effort: Pulmonary effort is normal.     Breath sounds: Normal breath sounds.  Abdominal:     General: Bowel sounds are normal.     Palpations: Abdomen is soft.  Skin:    General: Skin is warm and dry.     Capillary Refill: Capillary refill takes less than 2 seconds.  Neurological:     Mental Status: She is alert and oriented to person, place, and time.  Psychiatric:        Mood and Affect: Mood normal.        Behavior: Behavior normal.     Comments: Agitated, anxious, yelling throughout exam, endorses SI without plan, HI reporting she wishes her mother would die, wishes various illnesses  upon her     ED Results / Procedures / Treatments   Labs (all labs ordered are listed, but only abnormal results are displayed) Labs Reviewed  BASIC METABOLIC PANEL - Abnormal; Notable for the following components:      Result Value   Glucose, Bld 102 (*)    All other components within normal limits  SALICYLATE LEVEL - Abnormal; Notable for the following components:   Salicylate Lvl [id number] (*)    All other components within normal limits  ACETAMINOPHEN LEVEL - Abnormal; Notable for the following components:   Acetaminophen (Tylenol), Serum <10 (*)    All other components within normal limits  CBC  ETHANOL  URINALYSIS, ROUTINE W REFLEX MICROSCOPIC  RAPID URINE DRUG SCREEN, HOSP PERFORMED    EKG None  Radiology No results found.  Procedures Procedures    Medications Ordered in ED Medications  LORazepam (ATIVAN) tablet 1 mg (has no administration in time range)    ED Course/ Medical Decision Making/ A&P Clinical Course as of 12/07/22 1505  Fri Dec 07, 2022  1456 Medically cleared for TTS eval [CP]    Clinical Course User Index [CP] Anselmo Pickler, PA-C                             Medical Decision Making Amount and/or Complexity of Data Reviewed Labs: ordered.  Risk Prescription drug management.   Patient is a 57 y.o. female  who presents to the emergency department for psychiatric complaint.  Past Medical History: CAD, DM, prior PE, prior stroke, anxiety, depression  Physical Exam: Agitated, yelling, endorsing HI to mother, passive SI without plan, denies AVH  Labs: Medical clearance labs ordered.  No abnormality in CBC, BMP, no toxic ingestions noted.  UDS is pending.  Medications: Ordered Ativan as needed for agitation  Disposition: Patient is otherwise medically cleared at this time pending medical clearance laboratory evaluation. Will consult TTS and appreciate their recommendations.  I discussed this case with my attending physician Dr.  Billy Fischer who cosigned this note including patient's presenting symptoms, physical exam, and planned diagnostics and interventions. Attending physician stated agreement with plan or made changes to plan which were implemented.   Final Clinical Impression(s) / ED Diagnoses Final diagnoses:  None    Rx / DC Orders ED Discharge Orders     None         Dorien Chihuahua 12/07/22 1505    Gareth Morgan, MD 12/09/22 [phone]

## 2022-12-07 NOTE — ED Notes (Addendum)
Pt changed into purple scrubs. Wanded by security. Pt has a pt belongings bag, purse, and a small bag.

## 2022-12-07 NOTE — ED Triage Notes (Signed)
Patient arrives ambulatory with cane by POV c/o having a mental breakdown. Patient states recent separation from her husband and now living with her parents over the past two weeks. Patient states "I hate them and I hope they die." Patient states she has had multiple strokes and is unable to care for herself. Patient cursing and boisterous in triage.

## 2022-12-07 NOTE — ED Provider Notes (Signed)
  Physical Exam  BP 107/71 (BP Location: Right Arm)   Pulse 65   Temp 98.4 F (36.9 C) (Oral)   Resp 18   Ht 5\' 4"  (1.626 m)   Wt 76.2 kg   SpO2 97%   BMI 28.84 kg/m   Physical Exam  Procedures  Procedures  ED Course / MDM   Clinical Course as of 12/07/22 1911  Fri Dec 07, 2022  1456 Medically cleared for TTS eval [CP]    Clinical Course User Index [CP] Anselmo Pickler, PA-C   Medical Decision Making I was notified by nursing that patient is medically clear.  Per psychiatry note, patient has a place to stay and does not meet criteria for psychiatric admission.  Patient was medically cleared by previous provider.  Stable for discharge  Problems Addressed: Adjustment disorder with anxious mood: acute illness or injury  Amount and/or Complexity of Data Reviewed Labs: ordered. Decision-making details documented in ED Course.  Risk Prescription drug management.          Drenda Freeze, MD 12/07/22 [phone]

## 2022-12-07 NOTE — Progress Notes (Signed)
Pt has been psych cleared per provider Mallie Darting, NP. This CSW will remove pt from the [facility] shift report. TOC will assist with any discharge needs.  Benjaman Kindler, MSW, [facility] 12/07/2022 7:49 PM

## 2022-12-07 NOTE — ED Notes (Signed)
Pt remains stable cooperative compliant affect bright mood stable being picked up by father who called and stated he was out front of the ED waiting to pick pt up. Mrs. Gustave was take to her transportation via wheel chair and all belongings received per Mrs. Grater.
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
## 2022-12-07 NOTE — Discharge Instructions (Addendum)
Please stay with your family and take your medicines as prescribed  See your counselor  Return to ER if you have thoughts of harming yourself or others, hallucinations

## 2022-12-07 NOTE — ED Notes (Signed)
Pt refusing blood draw at this time.  Pt states she does not need blood work done and she does not need to see a medical doctor, she needs to see the mental doctor.

## 2022-12-07 NOTE — ED Notes (Signed)
Pt belongings have been moved to locker in [facility]).
